# Patient Record
Sex: Female | Born: 1985 | Race: White | Hispanic: No | Marital: Married | Smoking: Never smoker
Health system: Southern US, Community
[De-identification: ages and names within clinical notes are randomized; demographics above are authoritative.]

## PROBLEM LIST (undated history)

## (undated) ENCOUNTER — Inpatient Hospital Stay (HOSPITAL_COMMUNITY): Payer: Self-pay

## (undated) DIAGNOSIS — O039 Complete or unspecified spontaneous abortion without complication: Secondary | ICD-10-CM

## (undated) DIAGNOSIS — Z789 Other specified health status: Secondary | ICD-10-CM

## (undated) HISTORY — DX: Complete or unspecified spontaneous abortion without complication: O03.9

## (undated) HISTORY — PX: DILATION AND CURETTAGE OF UTERUS: SHX78

## (undated) HISTORY — DX: Other specified health status: Z78.9

## (undated) HISTORY — PX: FRACTURE SURGERY: SHX138

---
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
## 2009-05-10 ENCOUNTER — Other Ambulatory Visit: Admission: RE | Admit: 2009-05-10 | Discharge: 2009-05-10 | Payer: Self-pay

## 2009-05-10 ENCOUNTER — Other Ambulatory Visit: Admission: RE | Admit: 2009-05-10 | Discharge: 2009-05-10 | Payer: Self-pay | Admitting: Unknown Physician Specialty

## 2011-07-16 ENCOUNTER — Emergency Department: Payer: Self-pay | Admitting: Emergency Medicine

## 2014-12-20 ENCOUNTER — Other Ambulatory Visit: Payer: Self-pay | Admitting: Obstetrics & Gynecology

## 2014-12-20 DIAGNOSIS — O3680X0 Pregnancy with inconclusive fetal viability, not applicable or unspecified: Secondary | ICD-10-CM

## 2014-12-21 ENCOUNTER — Ambulatory Visit (INDEPENDENT_AMBULATORY_CARE_PROVIDER_SITE_OTHER): Payer: Medicaid Other

## 2014-12-21 DIAGNOSIS — O3680X0 Pregnancy with inconclusive fetal viability, not applicable or unspecified: Secondary | ICD-10-CM | POA: Diagnosis not present

## 2014-12-21 NOTE — Progress Notes (Signed)
US 7+5wks,single IUP w/ys pos fht 163bpm,normal ov's bilat,crl 18.695mm

## 2014-12-29 ENCOUNTER — Ambulatory Visit (INDEPENDENT_AMBULATORY_CARE_PROVIDER_SITE_OTHER): Payer: Medicaid Other | Admitting: Women's Health

## 2014-12-29 ENCOUNTER — Encounter: Payer: Self-pay | Admitting: Women's Health

## 2014-12-29 VITALS — BP 134/82 | HR 92 | Ht 65.5 in | Wt 188.0 lb

## 2014-12-29 DIAGNOSIS — Z369 Encounter for antenatal screening, unspecified: Secondary | ICD-10-CM

## 2014-12-29 DIAGNOSIS — Z1389 Encounter for screening for other disorder: Secondary | ICD-10-CM

## 2014-12-29 DIAGNOSIS — Z3682 Encounter for antenatal screening for nuchal translucency: Secondary | ICD-10-CM

## 2014-12-29 DIAGNOSIS — Z0283 Encounter for blood-alcohol and blood-drug test: Secondary | ICD-10-CM

## 2014-12-29 DIAGNOSIS — Z3491 Encounter for supervision of normal pregnancy, unspecified, first trimester: Secondary | ICD-10-CM

## 2014-12-29 DIAGNOSIS — Z3481 Encounter for supervision of other normal pregnancy, first trimester: Secondary | ICD-10-CM

## 2014-12-29 DIAGNOSIS — Z3A09 9 weeks gestation of pregnancy: Secondary | ICD-10-CM | POA: Diagnosis not present

## 2014-12-29 DIAGNOSIS — Z3490 Encounter for supervision of normal pregnancy, unspecified, unspecified trimester: Secondary | ICD-10-CM | POA: Insufficient documentation

## 2014-12-29 DIAGNOSIS — R110 Nausea: Secondary | ICD-10-CM

## 2014-12-29 DIAGNOSIS — Z331 Pregnant state, incidental: Secondary | ICD-10-CM

## 2014-12-29 LAB — POCT URINALYSIS DIPSTICK
Glucose, UA: NEGATIVE
KETONES UA: NEGATIVE
NITRITE UA: NEGATIVE
Protein, UA: NEGATIVE
RBC UA: NEGATIVE

## 2014-12-29 NOTE — Progress Notes (Signed)
  Subjective:  Megan Hart is a 29 y.o. 692P1001 Caucasian female at 76713w6d by LMP c/w 8wk u/s, being seen today for her first obstetrical visit.  Her obstetrical history is significant for term uncomplicated svb x 1- baby had MAS and had to stay in hospital briefly.  Reports h/o borderline bp's- never been dx w/ HTN or been on meds. Pregnancy history fully reviewed.  Patient reports some nausea, no vomiting- declines meds. Denies vb, cramping, uti s/s, abnormal/malodorous vag d/c, or vulvovaginal itching/irritation.  BP 134/82 mmHg  Pulse 92  Wt 188 lb (85.276 kg)  LMP 10/28/2014 (Exact Date)  HISTORY: OB History  Gravida Para Term Preterm AB SAB TAB Ectopic Multiple Living  2 1 1       1     # Outcome Date GA Lbr Len/2nd Weight Sex Delivery Anes PTL Lv  2 Current           1 Term 09/27/05 5842w0d  6 lb 9 oz (2.977 kg) M Vag-Spont None N Y     Past Medical History  Diagnosis Date  . Medical history non-contributory    Past Surgical History  Procedure Laterality Date  . Fracture surgery     Family History  Problem Relation Age of Onset  . Hypertension Mother   . Hypertension Father   . Heart disease Father   . Aneurysm Father     Exam   System:     General: Well developed & nourished, no acute distress   Skin: Warm & dry, normal coloration and turgor, no rashes   Neurologic: Alert & oriented, normal mood   Cardiovascular: Regular rate & rhythm   Respiratory: Effort & rate normal, LCTAB, acyanotic   Abdomen: Soft, non tender   Extremities: normal strength, tone  Thin prep pap smear neg 2015 @ [facility]   Assessment:   Pregnancy: G2P1001 Patient Active Problem List   Diagnosis Date Noted  . Supervision of normal pregnancy 12/29/2014    Priority: High    29713w6d G2P1001 New OB visit H/O borderline bp's Nausea  Plan:  Initial labs drawn Continue prenatal vitamins Problem list reviewed and updated Reviewed n/v relief measures and warning s/s to report Reviewed  recommended weight gain based on pre-gravid BMI Encouraged well-balanced diet Genetic Screening discussed Integrated Screen: requested Cystic fibrosis screening discussed declined Ultrasound discussed; fetal survey: requested Follow up in 4 weeks for 1st it/nt and visit [facility] completed Declines flu shot  Marge DuncansBooker, [place] Cordts Randall CNM, [facility]NP-BC 12/29/2014 2:46 PM

## 2014-12-29 NOTE — Patient Instructions (Signed)

## 2014-12-30 ENCOUNTER — Telehealth: Payer: Self-pay | Admitting: Adult Health

## 2014-12-30 LAB — GC/CHLAMYDIA PROBE AMP
Chlamydia trachomatis, NAA: NEGATIVE
NEISSERIA GONORRHOEAE BY PCR: NEGATIVE

## 2014-12-30 NOTE — Telephone Encounter (Signed)
Spoke with pt. Pt noticed spotting last night after a BM. No spotting this am. I advised it's not uncommon to have spotting after a BM or after sex. Advised to increase fluids today and don't have sex for 7 days from the last time she wipes any color. Pt voiced understanding. JSY

## 2014-12-31 LAB — PMP SCREEN PROFILE (10S), URINE
Amphetamine Screen, Ur: NEGATIVE ng/mL
BARBITURATE SCRN UR: NEGATIVE ng/mL
BENZODIAZEPINE SCREEN, URINE: NEGATIVE ng/mL
CREATININE(CRT), U: 77.3 mg/dL (ref 20.0–300.0)
Cannabinoids Ur Ql Scn: NEGATIVE ng/mL
Cocaine(Metab.)Screen, Urine: NEGATIVE ng/mL
Methadone Scn, Ur: NEGATIVE ng/mL
Opiate Scrn, Ur: NEGATIVE ng/mL
Oxycodone+Oxymorphone Ur Ql Scn: NEGATIVE ng/mL
PCP Scrn, Ur: NEGATIVE ng/mL
PH UR, DRUG SCRN: 6.2 (ref 4.5–8.9)
PROPOXYPHENE SCREEN: NEGATIVE ng/mL

## 2014-12-31 LAB — RPR: RPR Ser Ql: NONREACTIVE

## 2014-12-31 LAB — CBC
HEMATOCRIT: 38.5 % (ref 34.0–46.6)
HEMOGLOBIN: 12.6 g/dL (ref 11.1–15.9)
MCH: 27.2 pg (ref 26.6–33.0)
MCHC: 32.7 g/dL (ref 31.5–35.7)
MCV: 83 fL (ref 79–97)
Platelets: 415 10*3/uL — ABNORMAL HIGH (ref 150–379)
RBC: 4.64 x10E6/uL (ref 3.77–5.28)
RDW: 15.3 % (ref 12.3–15.4)
WBC: 12.1 10*3/uL — ABNORMAL HIGH (ref 3.4–10.8)

## 2014-12-31 LAB — URINALYSIS, ROUTINE W REFLEX MICROSCOPIC
BILIRUBIN UA: NEGATIVE
GLUCOSE, UA: NEGATIVE
KETONES UA: NEGATIVE
NITRITE UA: NEGATIVE
Protein, UA: NEGATIVE
RBC UA: NEGATIVE
SPEC GRAV UA: 1.013 (ref 1.005–1.030)
UUROB: 0.2 mg/dL (ref 0.2–1.0)
pH, UA: 6.5 (ref 5.0–7.5)

## 2014-12-31 LAB — HIV ANTIBODY (ROUTINE TESTING W REFLEX): HIV SCREEN 4TH GENERATION: NONREACTIVE

## 2014-12-31 LAB — MICROSCOPIC EXAMINATION
Casts: NONE SEEN /lpf
Epithelial Cells (non renal): >10 /hpf — AB (ref 0–10)

## 2014-12-31 LAB — ABO/RH: RH TYPE: POSITIVE

## 2014-12-31 LAB — RUBELLA SCREEN: Rubella Antibodies, IGG: <0.9 index — ABNORMAL LOW (ref >0.99)

## 2014-12-31 LAB — URINE CULTURE

## 2014-12-31 LAB — VARICELLA ZOSTER ANTIBODY, IGG: VARICELLA: 368 {index} (ref >165)

## 2014-12-31 LAB — ANTIBODY SCREEN: ANTIBODY SCREEN: NEGATIVE

## 2014-12-31 LAB — HEPATITIS B SURFACE ANTIGEN: HEP B S AG: NEGATIVE

## 2015-01-03 ENCOUNTER — Telehealth: Payer: Self-pay | Admitting: Women's Health

## 2015-01-03 ENCOUNTER — Encounter: Payer: Self-pay | Admitting: Women's Health

## 2015-01-03 DIAGNOSIS — O9989 Other specified diseases and conditions complicating pregnancy, childbirth and the puerperium: Secondary | ICD-10-CM

## 2015-01-03 DIAGNOSIS — O09899 Supervision of other high risk pregnancies, unspecified trimester: Secondary | ICD-10-CM | POA: Insufficient documentation

## 2015-01-03 DIAGNOSIS — Z2839 Other underimmunization status: Secondary | ICD-10-CM | POA: Insufficient documentation

## 2015-01-03 DIAGNOSIS — Z283 Underimmunization status: Secondary | ICD-10-CM | POA: Insufficient documentation

## 2015-01-03 NOTE — Telephone Encounter (Signed)
Pt c/o spotting during early pregnancy x 6 days, only noted when she wipes after voiding, light cramping. Pt informed to continue to monitor spotting if increases call our office back. Pt verbalized understanding.  Pt ultrasound on 12/21/2014 WNL. Pt is scheduled for an ultrasound appt on 01/26/2015 and also to see a provider.

## 2015-01-18 ENCOUNTER — Encounter: Payer: Self-pay | Admitting: Obstetrics and Gynecology

## 2015-01-18 ENCOUNTER — Telehealth: Payer: Self-pay | Admitting: Women's Health

## 2015-01-18 ENCOUNTER — Ambulatory Visit (INDEPENDENT_AMBULATORY_CARE_PROVIDER_SITE_OTHER): Payer: Medicaid Other | Admitting: Obstetrics and Gynecology

## 2015-01-18 VITALS — BP 146/100 | HR 90 | Wt 188.0 lb

## 2015-01-18 DIAGNOSIS — O209 Hemorrhage in early pregnancy, unspecified: Secondary | ICD-10-CM | POA: Diagnosis not present

## 2015-01-18 DIAGNOSIS — O021 Missed abortion: Secondary | ICD-10-CM | POA: Diagnosis not present

## 2015-01-18 DIAGNOSIS — Z3A11 11 weeks gestation of pregnancy: Secondary | ICD-10-CM | POA: Diagnosis not present

## 2015-01-18 DIAGNOSIS — Z1389 Encounter for screening for other disorder: Secondary | ICD-10-CM | POA: Diagnosis not present

## 2015-01-18 DIAGNOSIS — Z331 Pregnant state, incidental: Secondary | ICD-10-CM | POA: Diagnosis not present

## 2015-01-18 LAB — POCT URINALYSIS DIPSTICK
GLUCOSE UA: NEGATIVE
KETONES UA: NEGATIVE
Leukocytes, UA: NEGATIVE
Nitrite, UA: NEGATIVE
PROTEIN UA: NEGATIVE
RBC UA: 1

## 2015-01-18 NOTE — Telephone Encounter (Signed)
Pt states having vaginal spotting today, noticed in her under garments and when she wiped, no straining with BM, no sex, no pain, no cramping. Pt states she has been having spotting for several weeks and is concerned. Pt given an appt for this afternoon for evaluation.

## 2015-01-18 NOTE — Progress Notes (Signed)
Pt states that she started having some pinkish discharge around 12 today that has now turned to a brownish color. Pt denies any pain.

## 2015-01-18 NOTE — Progress Notes (Signed)
Preoperative History and Physical  Megan Hart is a 29 y.o. G2P1001 here for first trimester bleeding. She is evaluated with u/s by me which shows a nonviable 2.1 cm fetal pole WITHOUT  FCA and with a thickened nuchal translucency that encircles the fetal head. She is counselled for  surgical management of missed AB by suction Dilation and curettage later this week. Pt is to let us know preferred date of procedure..   No significant preoperative concerns.  Proposed surgery: Suction Dilation and Curettage.  Past Medical History  Diagnosis Date  . Medical history non-contributory    Past Surgical History  Procedure Laterality Date  . Fracture surgery     OB History  Gravida Para Term Preterm AB SAB TAB Ectopic Multiple Living  2 1 1       1     # Outcome Date GA Lbr Len/2nd Weight Sex Delivery Anes PTL Lv  2 Current           1 Term 09/27/05 4861w0d  2.977 kg (6 lb 9 oz) M Vag-Spont None N Y    Patient denies any other pertinent gynecologic issues.   Current Outpatient Prescriptions on File Prior to Visit  Medication Sig Dispense Refill  . Multiple Vitamins-Calcium (ONE-A-DAY WOMENS PO) Take by mouth.     No current facility-administered medications on file prior to visit.   No Known Allergies  Social History:   reports that she has never smoked. She does not have any smokeless tobacco history on file. She reports that she does not drink alcohol or use illicit drugs.  Family History  Problem Relation Age of Onset  . Hypertension Mother   . Hypertension Father   . Heart disease Father   . Aneurysm Father     Review of Systems: Noncontributory  PHYSICAL EXAM: Blood pressure 146/100, pulse 90, weight 85.276 kg (188 lb), last menstrual period 10/28/2014. General appearance - alert, well appearing, and in no distress Chest - clear to auscultation, no wheezes, rales or rhonchi, symmetric air entry Heart - normal rate and regular rhythm Abdomen - soft, nontender, nondistended,  no masses or organomegaly Pelvic - ext genitatila normal           Vagina normal            Cervix closed, lite bleeding             U/s By me  Thickened posteior placenta, 2.1 cm fetal pole with absent FCA, and thickened NT grossly abnormal                                 Normal af volume           Adnexa normal Extremities - peripheral pulses normal, no pedal edema, no clubbing or cyanosis  Labs: Results for orders placed or performed in visit on 01/18/15 (from the past 336 hour(s))  POCT urinalysis dipstick   Collection Time: 01/18/15  3:54 PM  Result Value Ref Range   Color, UA     Clarity, UA     Glucose, UA neg    Bilirubin, UA     Ketones, UA neg    Spec Grav, UA     Blood, UA 1    pH, UA     Protein, UA neg    Urobilinogen, UA     Nitrite, UA neg    Leukocytes, UA Negative Negative    Imaging Studies:  US Ob Comp Less 14 Wks  12/27/2014  DATING AND VIABILITY SONOGRAM Megan Hart is a 30 y.o. year old G1P0 with LMP 10/28/2014 which would correlate to  [redacted]w[redacted]d weeks gestation.  She has regular menstrual cycles.   She is here today for a confirmatory initial sonogram. GESTATION: SINGLETON   FETAL ACTIVITY:          Heart rate         163 CERVIX: Appears closed ADNEXA: The ovaries are normal. GESTATIONAL AGE AND  BIOMETRICS: Gestational criteria: Estimated Date of Delivery: 08/04/15 by LMP now at [redacted]w[redacted]d Previous Scans:0    CROWN RUMP LENGTH           18.5 mm         8+2 weeks                                                                      AVERAGE EGA(BY THIS SCAN):  8+2 weeks WORKING EDD( LMP ):  08/04/2015  TECHNICIAN COMMENTS: [place] 7+5wks,single IUP w/ys pos fht 163bpm,normal ov's bilat,crl 18.57mm A copy of this report including all images has been saved and backed up to a second source for retrieval if needed. All measures and details of the anatomical scan, placentation, fluid volume and pelvic anatomy are contained in that report. Megan Hart 12/21/2014 4:20 PM Clinical  Impression and recommendations: I have reviewed the sonogram results above. Combined with the patient's current clinical course, below are my impressions and any appropriate recommendations for management based on the sonographic findings: Singleton IUP with EDD confirmend. Estimated Date of Delivery: 08/04/15 genterally normal maternal anatoomy Megan Hart  Repeat u/s 12/6  : nonviable fetus, absent FCA, posterior placenta, normal adnexa. jvf  Assessment: Patient Active Problem List   Diagnosis Date Noted  . Rubella non-immune status, antepartum 01/03/2015  . Supervision of normal pregnancy 12/29/2014    Plan: Patient will undergo surgical management with Suction Dilation and curettage Pt will let us know in a.m. Which day works for her. Will perform this week [name], MD .    .mec 01/18/2015 5:36 PM

## 2015-01-19 ENCOUNTER — Other Ambulatory Visit: Payer: Self-pay | Admitting: Obstetrics and Gynecology

## 2015-01-19 ENCOUNTER — Telehealth: Payer: Self-pay | Admitting: Obstetrics and Gynecology

## 2015-01-19 DIAGNOSIS — O021 Missed abortion: Secondary | ICD-10-CM

## 2015-01-19 MED ORDER — MISOPROSTOL 200 MCG PO TABS: ORAL_TABLET | ORAL | Status: DC

## 2015-01-26 ENCOUNTER — Encounter: Payer: Medicaid Other | Admitting: Obstetrics & Gynecology

## 2015-01-26 ENCOUNTER — Other Ambulatory Visit: Payer: Medicaid Other

## 2015-02-02 ENCOUNTER — Encounter: Payer: Self-pay | Admitting: Obstetrics and Gynecology

## 2015-02-02 ENCOUNTER — Ambulatory Visit (INDEPENDENT_AMBULATORY_CARE_PROVIDER_SITE_OTHER): Payer: Medicaid Other | Admitting: Obstetrics and Gynecology

## 2015-02-02 VITALS — BP 120/80 | Ht 65.0 in | Wt 188.0 lb

## 2015-02-02 DIAGNOSIS — O034 Incomplete spontaneous abortion without complication: Secondary | ICD-10-CM

## 2015-02-02 DIAGNOSIS — N939 Abnormal uterine and vaginal bleeding, unspecified: Secondary | ICD-10-CM

## 2015-02-02 NOTE — Progress Notes (Signed)
Patient ID: Megan Lesia HausenM Hart, female   DOB: 05/13/1985, 29 y.o.   MRN: [id number]   [facility]  Patient name: Megan Hart MRN [id number]  Date of birth: 12/21/1985  CC & HPI:  Megan Hart is a 29 y.o. female presenting today for follow up [facility]. Pt was seen in the office on 01/18/15 and had fetal u/s showing nonviable fetus at at 10224w5d. Pt was prescribed cytotec by this office, but did not have it filled. She was counselled forsurgical management of missed AB by suction Dilation and curettage later that week if cytotec was ineffective, but [facility] and vaginal bleeding 2 days later. Pt reports that she has stopped bleeding since D&C. She notes that she is looking to get pregnant in the next few months.   ROS:  A complete 10 system review of systems was obtained and all systems are negative except as noted in the HPI and PMH.    Pertinent History Reviewed:   Reviewed: Significant for D&C of uterus; spontaneous abortion  Medical         Past Medical History  Diagnosis Date   Medical history non-contributory                               Surgical Hx:    Past Surgical History  Procedure Laterality Date   Fracture surgery     Dilation and curettage of uterus     Medications: Reviewed & Updated - see associated section                       Current outpatient prescriptions:    Multiple Vitamins-Calcium (ONE-A-DAY WOMENS PO), Take by mouth., Disp: , Rfl:    Social History: Reviewed -  reports that she has never smoked. She has never used smokeless tobacco.  Objective Findings:  Vitals: Blood pressure 120/80, height 5\' 5"  (1.651 m), weight 188 lb (85.276 kg), last menstrual period 10/28/2014, not currently breastfeeding.  Physical Examination: General appearance - alert, well appearing, and in no distress Mental status - alert, oriented to person, place, and time  Discussed with pt well-being s/p spontaneous  abortion and D&C. At end of discussion, pt had opportunity to ask questions and has no further questions at this time. Pt does not wish to start oral contraceptives and desires to conceive. Greater than 50% was spent in counseling and coordination of care with the patient. Total time greater than: 25 minutes   Assessment & Plan:   A:  1. S/p D&C on 01/20/15 2. Vaginal bleeding stopped 3. Pt expresses desire to conceive soon  P:  1. Advised pt to follow ovulation patterns using [email or web address] 2. F/u prn      By signing my name below, I, Doreatha MartinEva Mathews, attest that this documentation has been prepared under the direction and in the presence of Tilda BurrowJohn Ferguson V, MD. Electronically Signed: Doreatha MartinEva Mathews, ED Scribe. 02/02/2015. 9:49 AM.  I personally performed the services described in this documentation, which was SCRIBED in my presence. The recorded information has been reviewed and considered accurate. It has been edited as necessary during review. [name],JOHN V, MD

## 2015-02-02 NOTE — Progress Notes (Signed)
Patient ID: Megan Hart, female   DOB: 10/24/1985, 29 y.o.   MRN: [id number] Pt here today for follow up visit after a D&C. Pt states that she has stopped bleeding and that she has a back cramp every now and then but nothing major.

## 2015-04-07 ENCOUNTER — Other Ambulatory Visit: Payer: Self-pay | Admitting: Obstetrics & Gynecology

## 2015-04-07 DIAGNOSIS — O3680X0 Pregnancy with inconclusive fetal viability, not applicable or unspecified: Secondary | ICD-10-CM

## 2015-04-14 ENCOUNTER — Ambulatory Visit (INDEPENDENT_AMBULATORY_CARE_PROVIDER_SITE_OTHER): Payer: Medicaid Other

## 2015-04-14 DIAGNOSIS — O3680X0 Pregnancy with inconclusive fetal viability, not applicable or unspecified: Secondary | ICD-10-CM | POA: Diagnosis not present

## 2015-04-14 NOTE — Progress Notes (Signed)
[place] 6+5wks,single IUP w/ a enlarged ys 6 mm,fhr 108 bpm,normal ov's bilat,results were discussed with Dr. Despina Hidden

## 2015-04-21 ENCOUNTER — Other Ambulatory Visit (HOSPITAL_COMMUNITY)
Admission: RE | Admit: 2015-04-21 | Discharge: 2015-04-21 | Disposition: A | Discharge status: Home / Self Care | Payer: Medicaid Other | Source: Ambulatory Visit | Attending: Adult Health | Admitting: Adult Health

## 2015-04-21 ENCOUNTER — Other Ambulatory Visit (INDEPENDENT_AMBULATORY_CARE_PROVIDER_SITE_OTHER): Payer: Medicaid Other

## 2015-04-21 ENCOUNTER — Ambulatory Visit (INDEPENDENT_AMBULATORY_CARE_PROVIDER_SITE_OTHER): Payer: Medicaid Other | Admitting: Adult Health

## 2015-04-21 ENCOUNTER — Encounter: Payer: Self-pay | Admitting: Adult Health

## 2015-04-21 ENCOUNTER — Other Ambulatory Visit: Payer: Self-pay | Admitting: Adult Health

## 2015-04-21 VITALS — BP 140/80 | HR 94 | Wt 191.5 lb

## 2015-04-21 DIAGNOSIS — Z113 Encounter for screening for infections with a predominantly sexual mode of transmission: Secondary | ICD-10-CM | POA: Insufficient documentation

## 2015-04-21 DIAGNOSIS — Z1151 Encounter for screening for human papillomavirus (HPV): Secondary | ICD-10-CM | POA: Diagnosis not present

## 2015-04-21 DIAGNOSIS — Z0283 Encounter for blood-alcohol and blood-drug test: Secondary | ICD-10-CM

## 2015-04-21 DIAGNOSIS — Z3A08 8 weeks gestation of pregnancy: Secondary | ICD-10-CM

## 2015-04-21 DIAGNOSIS — Z1389 Encounter for screening for other disorder: Secondary | ICD-10-CM | POA: Diagnosis not present

## 2015-04-21 DIAGNOSIS — Z331 Pregnant state, incidental: Secondary | ICD-10-CM | POA: Diagnosis not present

## 2015-04-21 DIAGNOSIS — O3680X0 Pregnancy with inconclusive fetal viability, not applicable or unspecified: Secondary | ICD-10-CM | POA: Diagnosis not present

## 2015-04-21 DIAGNOSIS — Z01411 Encounter for gynecological examination (general) (routine) with abnormal findings: Secondary | ICD-10-CM | POA: Diagnosis present

## 2015-04-21 DIAGNOSIS — Z369 Encounter for antenatal screening, unspecified: Secondary | ICD-10-CM

## 2015-04-21 DIAGNOSIS — O039 Complete or unspecified spontaneous abortion without complication: Secondary | ICD-10-CM | POA: Insufficient documentation

## 2015-04-21 HISTORY — DX: Complete or unspecified spontaneous abortion without complication: O03.9

## 2015-04-21 LAB — POCT URINALYSIS DIPSTICK
GLUCOSE UA: NEGATIVE
Ketones, UA: NEGATIVE
Leukocytes, UA: NEGATIVE
Nitrite, UA: NEGATIVE
Protein, UA: NEGATIVE
RBC UA: NEGATIVE

## 2015-04-21 NOTE — Progress Notes (Signed)
Tannie was here for new OB visit, has no bleeding,exam performed: Skin warm and dry. Neck: mid line trachea, normal thyroid, good ROM, no lymphadenopathy noted. Lungs: clear to ausculation bilaterally. Cardiovascular: regular rate and rhythm.Abdomen soft and non tender. Pelvic: external genitalia is normal in appearance no lesions, vagina: scant discharge without odor,urethra has no lesions or masses noted, cervix is  Bulbous, pap with GC/CHL and HPV performed, uterus: anterior, about 8 week size, shape and contour, non tender, no masses felt, adnexa: no masses or tenderness noted. Bladder is non tender and no masses felt, US performed by me and no FHM seen and then Amber did US and no FHM seen, fetal pole was about 7+5 weeks. Her Blood type is O+.She [facility] Dr Emelda FearFerguson. Discussed options of waiting to see what her body does, or cytotec or even D&C, and told her could even check another US next week, if she so desired.She is going to discuss with partner and let me know on next day or 2 her plans, but will schedule follow up appt. In 1 week, can cancel if not needed.  Face time 25 minutes, with 50 % counseling as above.

## 2015-04-21 NOTE — Progress Notes (Signed)
US  7+5wks,sigle IUP w/no fht,crl 13.959mm, normal ov's bilat,pt was seen by Victorino DikeJennifer after ultrasound

## 2015-04-21 NOTE — Patient Instructions (Signed)
Miscarriage A miscarriage is the sudden loss of an unborn baby (fetus) before the 20th week of pregnancy. Most miscarriages happen in the first 3 months of pregnancy. Sometimes, it happens before a woman even knows she is pregnant. A miscarriage is also called a "spontaneous miscarriage" or "early pregnancy loss." Having a miscarriage can be an emotional experience. Talk with your caregiver about any questions you may have about miscarrying, the grieving process, and your future pregnancy plans. CAUSES   Problems with the fetal chromosomes that make it impossible for the baby to develop normally. Problems with the baby's genes or chromosomes are most often the result of errors that occur, by chance, as the embryo divides and grows. The problems are not inherited from the parents.  Infection of the cervix or uterus.   Hormone problems.   Problems with the cervix, such as having an incompetent cervix. This is when the tissue in the cervix is not strong enough to hold the pregnancy.   Problems with the uterus, such as an abnormally shaped uterus, uterine fibroids, or congenital abnormalities.   Certain medical conditions.   Smoking, drinking alcohol, or taking illegal drugs.   Trauma.  Often, the cause of a miscarriage is unknown.  SYMPTOMS   Vaginal bleeding or spotting, with or without cramps or pain.  Pain or cramping in the abdomen or lower back.  Passing fluid, tissue, or blood clots from the vagina. DIAGNOSIS  Your caregiver will perform a physical exam. You may also have an ultrasound to confirm the miscarriage. Blood or urine tests may also be ordered. TREATMENT   Sometimes, treatment is not necessary if you naturally pass all the fetal tissue that was in the uterus. If some of the fetus or placenta remains in the body (incomplete miscarriage), tissue left behind may become infected and must be removed. Usually, a dilation and curettage (D and C) procedure is performed.  During a D and C procedure, the cervix is widened (dilated) and any remaining fetal or placental tissue is gently removed from the uterus.  Antibiotic medicines are prescribed if there is an infection. Other medicines may be given to reduce the size of the uterus (contract) if there is a lot of bleeding.  If you have Rh negative blood and your baby was Rh positive, you will need a Rh immunoglobulin shot. This shot will protect any future baby from having Rh blood problems in future pregnancies. HOME CARE INSTRUCTIONS   Your caregiver may order bed rest or may allow you to continue light activity. Resume activity as directed by your caregiver.  Have someone help with home and family responsibilities during this time.   Keep track of the number of sanitary pads you use each day and how soaked (saturated) they are. Write down this information.   Do not use tampons. Do not douche or have sexual intercourse until approved by your caregiver.   Only take over-the-counter or prescription medicines for pain or discomfort as directed by your caregiver.   Do not take aspirin. Aspirin can cause bleeding.   Keep all follow-up appointments with your caregiver.   If you or your partner have problems with grieving, talk to your caregiver or seek counseling to help cope with the pregnancy loss. Allow enough time to grieve before trying to get pregnant again.  SEEK IMMEDIATE MEDICAL CARE IF:   You have severe cramps or pain in your back or abdomen.  You have a fever.  You pass large blood clots (walnut-sized   or larger) ortissue from your vagina. Save any tissue for your caregiver to inspect.   Your bleeding increases.   You have a thick, bad-smelling vaginal discharge.  You become lightheaded, weak, or you faint.   You have chills.  MAKE SURE YOU:  Understand these instructions.  Will watch your condition.  Will get help right away if you are not doing well or get worse.   This  information is not intended to replace advice given to you by your health care provider. Make sure you discuss any questions you have with your health care provider.   Document Released: 07/25/2000 Document Revised: 05/26/2012 Document Reviewed: 03/20/2011 Elsevier Interactive Patient Education [employer]. Call me with decision

## 2015-04-21 NOTE — Addendum Note (Signed)
Addended by: Colen DarlingYOUNG, Sian Joles S on: 04/21/2015 03:06 PM   Modules accepted: Orders

## 2015-04-22 LAB — PMP SCREEN PROFILE (10S), URINE
AMPHETAMINE SCRN UR: NEGATIVE ng/mL
Barbiturate Screen, Ur: NEGATIVE ng/mL
Benzodiazepine Screen, Urine: NEGATIVE ng/mL
COCAINE(METAB.) SCREEN, URINE: NEGATIVE ng/mL
Cannabinoids Ur Ql Scn: NEGATIVE ng/mL
Creatinine(Crt), U: 175.6 mg/dL (ref 20.0–300.0)
METHADONE SCREEN, URINE: NEGATIVE ng/mL
OPIATE SCRN UR: NEGATIVE ng/mL
OXYCODONE+OXYMORPHONE UR QL SCN: NEGATIVE ng/mL
PCP SCRN UR: NEGATIVE ng/mL
PROPOXYPHENE SCREEN: NEGATIVE ng/mL
Ph of Urine: 5.8 (ref 4.5–8.9)

## 2015-04-22 NOTE — Progress Notes (Signed)
Addendum: ROS: Patient denies any headaches, hearing loss, fatigue, blurred vision, shortness of breath, chest pain, abdominal pain, problems with bowel movements, urination, or intercourse. No joint pain or mood swings.No bleeding Reviewed past medical,surgical, social and family history. Reviewed medications and allergies. VS: BP 140/80, wt: 191.5 lbs

## 2015-04-25 ENCOUNTER — Telehealth: Payer: Self-pay | Admitting: Adult Health

## 2015-04-25 ENCOUNTER — Encounter: Payer: Self-pay | Admitting: Obstetrics and Gynecology

## 2015-04-25 ENCOUNTER — Ambulatory Visit (INDEPENDENT_AMBULATORY_CARE_PROVIDER_SITE_OTHER): Payer: Medicaid Other | Admitting: Obstetrics and Gynecology

## 2015-04-25 ENCOUNTER — Other Ambulatory Visit: Payer: Self-pay | Admitting: Obstetrics and Gynecology

## 2015-04-25 VITALS — BP 142/90 | Ht 66.0 in | Wt 192.0 lb

## 2015-04-25 DIAGNOSIS — O039 Complete or unspecified spontaneous abortion without complication: Secondary | ICD-10-CM

## 2015-04-25 LAB — CYTOLOGY - PAP

## 2015-04-25 NOTE — Progress Notes (Signed)
   [facility]  Patient name: Megan Hart MRN [id number]  Date of birth: 12/23/1985  CC & HPI:  Megan Hart is a 30 y.o. female presenting today for follow-up of a mriscariage. Patient [facility] due to [facility]. Patient desires a D&C presently. Patient has known allergies to morphine, which cause nausea and vomiting. She states she does not have any dentures, partial plates, or caps.   ROS:  A complete review of systems was obtained and all systems are negative except as noted in the HPI and PMH.    Pertinent History Reviewed:   Reviewed: Significant for D&C of uterus Medical         Past Medical History  Diagnosis Date  . Medical history non-contributory   . Miscarriage 04/21/2015                              Surgical Hx:    Past Surgical History  Procedure Laterality Date  . Fracture surgery    . Dilation and curettage of uterus     Medications: Reviewed & Updated - see associated section                      No current outpatient prescriptions on file.   Social History: Reviewed -  reports that she has never smoked. She has never used smokeless tobacco.  Objective Findings:  Vitals: Blood pressure 142/90, height 5\' 6"  (1.676 m), weight 192 lb (87.091 kg), last menstrual period 02/21/2015, not currently breastfeeding.  Physical Examination: General appearance - alert, well appearing, and in no distress, oriented to person, place, and time and overweight Mental status - alert, oriented to person, place, and time, normal mood, behavior, speech, dress, motor activity, and thought processes, affect appropriate to mood Eyes - pupils equal and reactive, extraocular eye movements intact Mouth - mucous membranes moist, pharynx normal without lesions Chest - clear to auscultation, no wheezes, rales or rhonchi, symmetric air entry Heart - normal rate, regular rhythm, normal S1, S2, no murmurs, rubs, clicks or  gallops Abdomen - soft, nontender, nondistended, no masses or organomegaly  Pelvic: done per JAGriffin RN last week. Discussed with pt risks and benefits of D&C. At end of discussion, pt had opportunity to ask questions and has no further questions at this time.   Greater than 50% was spent in counseling and coordination of care with the patient. Total time greater than: 15 minutes    Assessment & Plan:   A:  1. Follow-up visit of miscarriage.   P:  1. Schedule Suction D&C for Friday 04/29/15 at 7:30 am..    By signing my name below, I, [name], attest that this documentation has been prepared under the direction and in the presence of Tilda BurrowJohn Kyara Boxer V, MD. Electronically Signed: [name], ED Scribe. 04/25/2015. 4:29 PM.  I personally performed the services described in this documentation, which was SCRIBED in my presence. The recorded information has been reviewed and considered accurate. It has been edited as necessary during review. [name],Adriel Kessen V, MD

## 2015-04-25 NOTE — Telephone Encounter (Signed)
Pt called earlier this am and wants to proceed with D&C to come in at 3:45 pm to see Dr Emelda FearFerguson for pre [facility]

## 2015-04-25 NOTE — Progress Notes (Signed)
Patient ID: Megan Hart, female   DOB: 11/16/1985, 30 y.o.   MRN: [id number] Pt here today for follow up of miscarriage. Pt denies any bleeding.

## 2015-04-26 ENCOUNTER — Other Ambulatory Visit: Payer: Self-pay | Admitting: Obstetrics and Gynecology

## 2015-04-26 NOTE — Patient Instructions (Signed)
Megan Hart  04/26/2015     @   Your procedure is scheduled on   04/29/2015   Report to [facility] at  615  A.M.  Call this number if you have problems the morning of surgery:  [phone]   Remember:  Do not eat food or drink liquids after midnight.  Take these medicines the morning of surgery with A SIP OF WATER  none   Do not wear jewelry, make-up or nail polish.  Do not wear lotions, powders, or perfumes.  You may wear deodorant.  Do not shave 48 hours prior to surgery.  Men may shave face and neck.  Do not bring valuables to the hospital.  [facility] is not responsible for any belongings or valuables.  Contacts, dentures or bridgework may not be worn into surgery.  Leave your suitcase in the car.  After surgery it may be brought to your room.  For patients admitted to the hospital, discharge time will be determined by your treatment team.  Patients discharged the day of surgery will not be allowed to drive home.   Name and phone number of your driver:   family Special instructions:  none  Please read over the following fact sheets that you were given. Coughing and Deep Breathing, Surgical Site Infection Prevention, Anesthesia Post-op Instructions and Care and Recovery After Surgery      Dilation and Curettage or Vacuum Curettage Dilation and curettage (D&C) and vacuum curettage are minor procedures. A D&C involves stretching (dilation) the cervix and scraping (curettage) the inside lining of the womb (uterus). During a D&C, tissue is gently scraped from the inside lining of the uterus. During a vacuum curettage, the lining and tissue in the uterus are removed with the use of gentle suction.  Curettage may be performed to either diagnose or treat a problem. As a diagnostic procedure, curettage is performed to examine tissues from the uterus. A diagnostic curettage may be performed for the following symptoms:   Irregular bleeding in  the uterus.   Bleeding with the development of clots.   Spotting between menstrual periods.   Prolonged menstrual periods.   Bleeding after menopause.   No menstrual period (amenorrhea).   A change in size and shape of the uterus.  As a treatment procedure, curettage may be performed for the following reasons:   Removal of an IUD (intrauterine device).   Removal of retained placenta after giving birth. Retained placenta can cause an infection or bleeding severe enough to require transfusions.   Abortion.   Miscarriage.   Removal of polyps inside the uterus.   Removal of uncommon types of noncancerous lumps (fibroids).  LET [facility] CARE PROVIDER KNOW ABOUT:   Any allergies you have.   All medicines you are taking, including vitamins, herbs, eye drops, creams, and over-the-counter medicines.   Previous problems you or members of your family have had with the use of anesthetics.   Any blood disorders you have.   Previous surgeries you have had.   Medical conditions you have. RISKS AND COMPLICATIONS  Generally, this is a safe procedure. However, as with any procedure, complications can occur. Possible complications include:  Excessive bleeding.   Infection of the uterus.   Damage to the cervix.   Development of scar tissue (adhesions) inside the uterus, later causing abnormal amounts of menstrual bleeding.   Complications from the general anesthetic, if a general anesthetic is used.  Putting a hole (perforation) in the uterus. This is rare.  BEFORE THE PROCEDURE   Eat and drink before the procedure only as directed by your health care provider.   Arrange for someone to take you home.  PROCEDURE  This procedure usually takes about 15-30 minutes.  You will be given one of the following:  A medicine that numbs the area in and around the cervix (local anesthetic).   A medicine to make you sleep through the procedure (general  anesthetic).  You will lie on your back with your legs in stirrups.   A warm metal or plastic instrument (speculum) will be placed in your vagina to keep it open and to allow the health care provider to see the cervix.  There are two ways in which your cervix can be softened and dilated. These include:   Taking a medicine.   Having thin rods (laminaria) inserted into your cervix.   A curved tool (curette) will be used to scrape cells from the inside lining of the uterus. In some cases, gentle suction is applied with the curette. The curette will then be removed.  AFTER THE PROCEDURE   You will rest in the recovery area until you are stable and are ready to go home.   You may feel sick to your stomach (nauseous) or throw up (vomit) if you were given a general anesthetic.   You may have a sore throat if a tube was placed in your throat during general anesthesia.   You may have light cramping and bleeding. This may last for 2 days to 2 weeks after the procedure.   Your uterus needs to make a new lining after the procedure. This may make your next period late.   This information is not intended to replace advice given to you by your health care provider. Make sure you discuss any questions you have with your health care provider.   Document Released: 01/29/2005 Document Revised: 10/01/2012 Document Reviewed: 08/28/2012 Elsevier Interactive Patient Education 2016 [employer] Dilation and Curettage or Vacuum Curettage, Care After These instructions give you information on caring for yourself after your procedure. Your doctor may also give you more specific instructions. Call your doctor if you have any problems or questions after your procedure. HOME CARE  Do not drive for 24 hours.  Wait 1 week before doing any activities that wear you out.  Take your temperature 2 times a day for 4 days. Write it down. Tell your doctor if you have a fever.  Do not stand for a long  time.  Do not lift, push, or pull anything over 10 pounds (4.5 kilograms).  Limit stair climbing to once or twice a day.  Rest often.  Continue with your usual diet.  Drink enough fluids to keep your pee (urine) clear or pale yellow.  If you have a hard time pooping (constipation), you may:  Take a medicine to help you go poop (laxative) as told by your doctor.  Eat more fruit and bran.  Drink more fluids.  Take showers, not baths, for as long as told by your doctor.  Do not swim or use a hot tub until your doctor says it is okay.  Have someone with you for 1-2 days after the procedure.  Do not douche, use tampons, or have sex (intercourse) for 2 weeks.  Only take medicines as told by your doctor. Do not take aspirin. It can cause bleeding.  Keep all doctor visits. GET HELP IF:  You have cramps or pain not helped by medicine.  You have new pain in the belly (abdomen).  You have a bad smelling fluid coming from your vagina.  You have a rash.  You have problems with any medicine. GET HELP RIGHT AWAY IF:   You start to bleed more than a regular period.  You have a fever.  You have chest pain.  You have trouble breathing.  You feel dizzy or feel like passing out (fainting).  You pass out.  You have pain in the tops of your shoulders.  You have vaginal bleeding with or without clumps of blood (blood clots). MAKE SURE YOU:  Understand these instructions.  Will watch your condition.  Will get help right away if you are not doing well or get worse.   This information is not intended to replace advice given to you by your health care provider. Make sure you discuss any questions you have with your health care provider.   Document Released: 11/08/2007 Document Revised: 02/03/2013 Document Reviewed: 08/28/2012 Elsevier Interactive Patient Education 2016 [employer] PATIENT INSTRUCTIONS POST-ANESTHESIA  IMMEDIATELY FOLLOWING SURGERY:  Do not drive or  operate machinery for the first twenty four hours after surgery.  Do not make any important decisions for twenty four hours after surgery or while taking narcotic pain medications or sedatives.  If you develop intractable nausea and vomiting or a severe headache please notify your doctor immediately.  FOLLOW-UP:  Please make an appointment with your surgeon as instructed. You do not need to follow up with anesthesia unless specifically instructed to do so.  WOUND CARE INSTRUCTIONS (if applicable):  Keep a dry clean dressing on the anesthesia/puncture wound site if there is drainage.  Once the wound has quit draining you may leave it open to air.  Generally you should leave the bandage intact for twenty four hours unless there is drainage.  If the epidural site drains for more than 36-48 hours please call the anesthesia department.  QUESTIONS?:  Please feel free to call your physician or the hospital operator if you have any questions, and they will be happy to assist you.

## 2015-04-26 NOTE — H&P (Signed)
  Tilda BurrowJohn Jolana Runkles V, MD at 04/25/2015 4:17 PM     Status: Signed       Expand All Collapse All    [facility] Visit  Patient name: Megan Judie PetitM LesterMRN [id number] Date of birth: 11/16/1985  CC & HPI:  Megan Hart is a 30 y.o. female presenting today for follow-up of a mriscariage. Patient [facility] due to pain. Patient desires a D&C presently. Patient has known allergies to morphine, which cause nausea and vomiting. She states she does not have any dentures, partial plates, or caps.  ROS:  A complete review of systems was obtained and all systems are negative except as noted in the HPI and PMH.   Pertinent History Reviewed:  Reviewed: Significant for D&C of uterus Medical  Past Medical History  Diagnosis Date  . Medical history non-contributory   . Miscarriage 04/21/2015    Surgical Hx:  Past Surgical History  Procedure Laterality Date  . Fracture surgery    . Dilation and curettage of uterus     Medications: Reviewed & Updated - see associated section  No current outpatient prescriptions on file.   Social History: Reviewed -  reports that she has never smoked. She has never used smokeless tobacco.  Objective Findings:  Vitals: Blood pressure 142/90, height 5\' 6"  (1.676 Megan), weight 192 lb (87.091 kg), last menstrual period 02/21/2015, not currently breastfeeding.  Physical Examination: General appearance - alert, well appearing, and in no distress, oriented to person, place, and time and overweight Mental status - alert, oriented to person, place, and time, normal mood, behavior, speech, dress, motor activity, and thought processes, affect appropriate to mood Eyes - pupils equal and reactive, extraocular eye movements intact Mouth - mucous membranes moist, pharynx normal without lesions Chest - clear to  auscultation, no wheezes, rales or rhonchi, symmetric air entry Heart - normal rate, regular rhythm, normal S1, S2, no murmurs, rubs, clicks or gallops Abdomen - soft, nontender, nondistended, no masses or organomegaly Pelvic: done per JAGriffin RN last week. Discussed with pt risks and benefits of D&C. At end of discussion, pt had opportunity to ask questions and has no further questions at this time.  Rh positivei per old records Greater than 50% was spent in counseling and coordination of care with the patient. Total time greater than: 15 minutes    Assessment & Plan:   A:  1. Follow-up visit of miscarriage.   P:  1. Schedule Suction D&C for Friday 04/29/15 at 7:30 am..   By signing my name below, I, [name], attest that this documentation has been prepared under the direction and in the presence of Tilda BurrowJohn Hideo Googe V, MD. Electronically Signed: [name], ED Scribe. 04/25/2015. 4:29 PM.  I personally performed the services described in this documentation, which was SCRIBED in my presence. The recorded information has been reviewed and considered accurate. It has been edited as necessary during review. [name],[name], MD

## 2015-04-27 ENCOUNTER — Encounter (HOSPITAL_COMMUNITY): Payer: Self-pay

## 2015-04-27 ENCOUNTER — Encounter (HOSPITAL_COMMUNITY)
Admission: RE | Admit: 2015-04-27 | Discharge: 2015-04-27 | Disposition: A | Discharge status: Home / Self Care | Payer: Medicaid Other | Source: Ambulatory Visit | Attending: Obstetrics and Gynecology | Admitting: Obstetrics and Gynecology

## 2015-04-27 ENCOUNTER — Other Ambulatory Visit: Payer: Self-pay | Admitting: Obstetrics and Gynecology

## 2015-04-27 DIAGNOSIS — Z01812 Encounter for preprocedural laboratory examination: Secondary | ICD-10-CM | POA: Diagnosis not present

## 2015-04-27 LAB — CBC
HEMATOCRIT: 34 % — AB (ref 36.0–46.0)
Hemoglobin: 10.9 g/dL — ABNORMAL LOW (ref 12.0–15.0)
MCH: 25.7 pg — ABNORMAL LOW (ref 26.0–34.0)
MCHC: 32.1 g/dL (ref 30.0–36.0)
MCV: 80.2 fL (ref 78.0–100.0)
PLATELETS: 416 10*3/uL — AB (ref 150–400)
RBC: 4.24 MIL/uL (ref 3.87–5.11)
RDW: 15.5 % (ref 11.5–15.5)
WBC: 9.7 10*3/uL (ref 4.0–10.5)

## 2015-04-27 NOTE — Pre-Procedure Instructions (Signed)
Patient given information to sign up for my chart at home. 

## 2015-04-27 NOTE — H&P (Signed)
  Progress Notes    Expand All Collapse All    [facility] Visit  Patient name: Megan HartMRN [id number] Date of birth: 03/31/1985  CC & HPI:  Megan Hart is a 30 y.o. female presenting today for follow-up of missed abortion. Pt has not passed tissue. Pt is NOT interested in Cytotec Therapy to provoke spont Ab.. Patient states she had a D&C at [facility] in the past for miscarriage due to pain. Patient desires a D&C presently. Patient has known allergies to morphine, which cause nausea and vomiting. She states she does not have any dentures, partial plates, or caps.  ROS:  A complete review of systems was obtained and all systems are negative except as noted in the HPI and PMH.   Pertinent History Reviewed:  Reviewed: Significant for D&C of uterus Medical  Past Medical History  Diagnosis Date  . Medical history non-contributory   . Miscarriage 04/21/2015    Surgical Hx:  Past Surgical History  Procedure Laterality Date  . Fracture surgery    . Dilation and curettage of uterus     Medications: Reviewed & Updated - see associated section  No current outpatient prescriptions on file.   Social History: Reviewed -  reports that she has never smoked. She has never used smokeless tobacco.  Objective Findings:  Vitals: Blood pressure 142/90, height 5' 6" (1.676 m), weight 192 lb (87.091 kg), last menstrual period 02/21/2015, not currently breastfeeding.  Physical Examination: General appearance - alert, well appearing, and in no distress, oriented to person, place, and time and overweight Mental status - alert, oriented to person, place, and time, normal mood, behavior, speech, dress, motor activity, and thought processes, affect appropriate to mood Eyes - pupils equal and reactive, extraocular eye movements intact Mouth - mucous membranes moist, pharynx normal  without lesions Chest - clear to auscultation, no wheezes, rales or rhonchi, symmetric air entry Heart - normal rate, regular rhythm, normal S1, S2, no murmurs, rubs, clicks or gallops Abdomen - soft, nontender, nondistended, no masses or organomegaly Pelvic: done per JAGriffin RN last week. Discussed with pt risks and benefits of D&C. At end of discussion, pt had opportunity to ask questions and has no further questions at this time.   Greater than 50% was spent in counseling and coordination of care with the patient. Total time greater than: 15 minutes    Assessment & Plan:   A:  1. Follow-up visit of miscarriage.   P:  1. Schedule Suction D&C for Friday 04/29/15 at 7:30 am..   By signing my name below, I, Suzanne Le, attest that this documentation has been prepared under the direction and in the presence of Jamison Yuhasz V, MD. Electronically Signed: Suzanne Le, ED Scribe. 04/25/2015. 4:29 PM.  I personally performed the services described in this documentation, which was SCRIBED in my presence. The recorded information has been reviewed and considered accurate. It has been edited as necessary during review.      

## 2015-04-28 ENCOUNTER — Ambulatory Visit: Payer: Medicaid Other | Admitting: Obstetrics and Gynecology

## 2015-04-29 ENCOUNTER — Encounter (HOSPITAL_COMMUNITY): Payer: Self-pay | Admitting: *Deleted

## 2015-04-29 ENCOUNTER — Ambulatory Visit (HOSPITAL_COMMUNITY): Payer: Medicaid Other | Admitting: Anesthesiology

## 2015-04-29 ENCOUNTER — Ambulatory Visit (HOSPITAL_COMMUNITY)
Admission: RE | Admit: 2015-04-29 | Discharge: 2015-04-29 | Disposition: A | Discharge status: Home / Self Care | Payer: Medicaid Other | Source: Ambulatory Visit | Attending: Obstetrics and Gynecology | Admitting: Obstetrics and Gynecology

## 2015-04-29 ENCOUNTER — Encounter (HOSPITAL_COMMUNITY)
Admission: RE | Disposition: A | Discharge status: Home / Self Care | Payer: Self-pay | Source: Ambulatory Visit | Attending: Obstetrics and Gynecology

## 2015-04-29 DIAGNOSIS — O021 Missed abortion: Secondary | ICD-10-CM | POA: Insufficient documentation

## 2015-04-29 HISTORY — PX: DILATION AND CURETTAGE OF UTERUS: SHX78

## 2015-04-29 SURGERY — DILATION AND CURETTAGE
Anesthesia: General | Site: Uterus

## 2015-04-29 MED ORDER — LIDOCAINE HCL (PF) 1 % IJ SOLN
INTRAMUSCULAR | Status: AC
  Filled 2015-04-29: qty 5

## 2015-04-29 MED ORDER — FENTANYL CITRATE (PF) 100 MCG/2ML IJ SOLN
INTRAMUSCULAR | Status: DC | PRN
  Administered 2015-04-29 (×2): 50 ug via INTRAVENOUS

## 2015-04-29 MED ORDER — PROPOFOL 10 MG/ML IV BOLUS
INTRAVENOUS | Status: AC
  Filled 2015-04-29: qty 20

## 2015-04-29 MED ORDER — HYDROCODONE-ACETAMINOPHEN 5-325 MG PO TABS: 1.0000 | ORAL_TABLET | Freq: Four times a day (QID) | ORAL | Status: DC | PRN

## 2015-04-29 MED ORDER — FENTANYL CITRATE (PF) 100 MCG/2ML IJ SOLN
INTRAMUSCULAR | Status: AC
  Filled 2015-04-29: qty 2

## 2015-04-29 MED ORDER — ONDANSETRON HCL 4 MG/2ML IJ SOLN
INTRAMUSCULAR | Status: AC
  Filled 2015-04-29: qty 2

## 2015-04-29 MED ORDER — MIDAZOLAM HCL 2 MG/2ML IJ SOLN
INTRAMUSCULAR | Status: AC
  Filled 2015-04-29: qty 2

## 2015-04-29 MED ORDER — LACTATED RINGERS IV SOLN
INTRAVENOUS | Status: DC
  Administered 2015-04-29: 07:00:00 via INTRAVENOUS

## 2015-04-29 MED ORDER — 0.9 % SODIUM CHLORIDE (POUR BTL) OPTIME
TOPICAL | Status: DC | PRN
  Administered 2015-04-29: 1000 mL

## 2015-04-29 MED ORDER — LIDOCAINE HCL (CARDIAC) 10 MG/ML IV SOLN
INTRAVENOUS | Status: DC | PRN
  Administered 2015-04-29: 50 mg via INTRAVENOUS

## 2015-04-29 MED ORDER — FENTANYL CITRATE (PF) 100 MCG/2ML IJ SOLN
25.0000 ug | INTRAMUSCULAR | Status: AC
  Administered 2015-04-29 (×2): 25 ug via INTRAVENOUS

## 2015-04-29 MED ORDER — FENTANYL CITRATE (PF) 100 MCG/2ML IJ SOLN: 25.0000 ug | INTRAMUSCULAR | Status: DC | PRN

## 2015-04-29 MED ORDER — MIDAZOLAM HCL 2 MG/2ML IJ SOLN
1.0000 mg | INTRAMUSCULAR | Status: DC | PRN
  Administered 2015-04-29: 2 mg via INTRAVENOUS

## 2015-04-29 MED ORDER — METHYLERGONOVINE MALEATE 0.2 MG/ML IJ SOLN
INTRAMUSCULAR | Status: AC
  Filled 2015-04-29: qty 1

## 2015-04-29 MED ORDER — ONDANSETRON HCL 4 MG/2ML IJ SOLN: 4.0000 mg | Freq: Once | INTRAMUSCULAR | Status: DC | PRN

## 2015-04-29 MED ORDER — ONDANSETRON HCL 4 MG/2ML IJ SOLN
4.0000 mg | Freq: Once | INTRAMUSCULAR | Status: AC
  Administered 2015-04-29: 4 mg via INTRAVENOUS

## 2015-04-29 MED ORDER — BUPIVACAINE-EPINEPHRINE (PF) 0.5% -1:200000 IJ SOLN
INTRAMUSCULAR | Status: AC
  Filled 2015-04-29: qty 30

## 2015-04-29 MED ORDER — PROPOFOL 10 MG/ML IV BOLUS
INTRAVENOUS | Status: DC | PRN
  Administered 2015-04-29: 150 mg via INTRAVENOUS
  Administered 2015-04-29: 50 mg via INTRAVENOUS

## 2015-04-29 SURGICAL SUPPLY — 21 items
BAG HAMPER (MISCELLANEOUS) ×2 IMPLANT
CLOTH BEACON ORANGE TIMEOUT ST (SAFETY) ×2 IMPLANT
COVER LIGHT HANDLE STERIS (MISCELLANEOUS) ×4 IMPLANT
COVER MAYO STAND XLG (DRAPE) ×2 IMPLANT
CURETTE VACUUM 9MM CVD CLR (CANNULA) ×2 IMPLANT
GAUZE SPONGE 4X4 16PLY XRAY LF (GAUZE/BANDAGES/DRESSINGS) ×2 IMPLANT
GLOVE BIOGEL PI IND STRL 7.0 (GLOVE) ×1 IMPLANT
GLOVE BIOGEL PI IND STRL 9 (GLOVE) ×1 IMPLANT
GLOVE BIOGEL PI INDICATOR 7.0 (GLOVE) ×1
GLOVE BIOGEL PI INDICATOR 9 (GLOVE) ×1
GLOVE ECLIPSE 9.0 STRL (GLOVE) ×2 IMPLANT
GOWN SPEC L3 XXLG W/TWL (GOWN DISPOSABLE) ×2 IMPLANT
GOWN STRL REUS W/TWL LRG LVL3 (GOWN DISPOSABLE) ×2 IMPLANT
KIT BERKELEY 1ST TRIMESTER 3/8 (MISCELLANEOUS) ×2 IMPLANT
KIT ROOM TURNOVER AP CYSTO (KITS) ×2 IMPLANT
MARKER SKIN DUAL TIP RULER LAB (MISCELLANEOUS) ×2 IMPLANT
NS IRRIG 1000ML POUR BTL (IV SOLUTION) ×2 IMPLANT
PACK BASIC III (CUSTOM PROCEDURE TRAY) ×1
PACK SRG BSC III STRL LF ECLPS (CUSTOM PROCEDURE TRAY) ×1 IMPLANT
PAD ARMBOARD 7.5X6 YLW CONV (MISCELLANEOUS) ×2 IMPLANT
SET BERKELEY SUCTION TUBING (SUCTIONS) ×2 IMPLANT

## 2015-04-29 NOTE — Brief Op Note (Signed)
04/29/2015  8:05 AM  PATIENT:  Megan Hart  30 y.o. female  PRE-OPERATIVE DIAGNOSIS:  missed abortion  POST-OPERATIVE DIAGNOSIS:  missed abortion  PROCEDURE:  Procedure(s): SUCTION DILATATION AND CURETTAGE (N/A)  SURGEON:  Surgeon(s) and Role:    * Tilda BurrowJohn Falyn Rubel V, MD - Primary  PHYSICIAN ASSISTANT:   ASSISTANTS: none   ANESTHESIA:   general and LMA  EBL:  Total I/O In: 600 [I.V.:600] Out: 0   BLOOD ADMINISTERED:none blood type is Rh+ so RhoGAM is not required  DRAINS: none   LOCAL MEDICATIONS USED:  MARCAINE     SPECIMEN:  Source of Specimen:  Products of conception  DISPOSITION OF SPECIMEN:  PATHOLOGY  COUNTS:  YES  TOURNIQUET:  * No tourniquets in log *  DICTATION: .Dragon Dictation  PLAN OF CARE: Discharge to home after PACU  PATIENT DISPOSITION:  PACU - hemodynamically stable.   Delay start of Pharmacological VTE agent (>24hrs) due to surgical blood loss or risk of bleeding: not applicable

## 2015-04-29 NOTE — Anesthesia Preprocedure Evaluation (Signed)
Anesthesia Evaluation  Patient identified by MRN, date of birth, ID band Patient awake    Reviewed: Allergy & Precautions, NPO status , Patient's Chart, lab work & pertinent test results  Airway Mallampati: II  TM Distance: >3 FB     Dental  (+) Teeth Intact, Dental Advisory Given   Pulmonary neg pulmonary ROS,    breath sounds clear to auscultation       Cardiovascular negative cardio ROS   Rhythm:Regular Rate:Normal     Neuro/Psych    GI/Hepatic negative GI ROS,   Endo/Other    Renal/GU      Musculoskeletal   Abdominal   Peds  Hematology   Anesthesia Other Findings   Reproductive/Obstetrics                             Anesthesia Physical Anesthesia Plan  ASA: I  Anesthesia Plan: General   Post-op Pain Management:    Induction: Intravenous  Airway Management Planned: LMA  Additional Equipment:   Intra-op Plan:   Post-operative Plan: Extubation in OR  Informed Consent: I have reviewed the patients History and Physical, chart, labs and discussed the procedure including the risks, benefits and alternatives for the proposed anesthesia with the patient or authorized representative who has indicated his/her understanding and acceptance.     Plan Discussed with:   Anesthesia Plan Comments:        Anesthesia Quick Evaluation  

## 2015-04-29 NOTE — Anesthesia Procedure Notes (Signed)
Procedure Name: LMA Insertion Date/Time: 04/29/2015 7:40 AM Performed by: Franco NonesYATES, Nyia Tsao S Pre-anesthesia Checklist: Patient identified, Patient being monitored, Emergency Drugs available, Timeout performed and Suction available Patient Re-evaluated:Patient Re-evaluated prior to inductionOxygen Delivery Method: Circle System Utilized Preoxygenation: Pre-oxygenation with 100% oxygen Intubation Type: IV induction Ventilation: Mask ventilation without difficulty LMA: LMA inserted LMA Size: 4.0 Number of attempts: 1 Placement Confirmation: positive ETCO2 and breath sounds checked- equal and bilateral Tube secured with: Tape Dental Injury: Teeth and Oropharynx as per pre-operative assessment

## 2015-04-29 NOTE — Interval H&P Note (Signed)
History and Physical Interval Note:  04/29/2015 7:07 AM  Megan Hart  has presented today for surgery, with the diagnosis of missed abortion  The various methods of treatment have been discussed with the patient and family. After consideration of risks, benefits and other options for treatment, the patient has consented to  Procedure(s): DILATATION AND CURETTAGE (N/A) as a surgical intervention .  The patient's history has been reviewed, patient examined, no change in status, stable for surgery.  I have reviewed the patient's chart and labs.  Questions were answered to the patient's satisfaction.     [name],Atalya Dano V

## 2015-04-29 NOTE — Anesthesia Postprocedure Evaluation (Signed)
Anesthesia Post Note  Patient: Megan Hart  Procedure(s) Performed: Procedure(s) (LRB): SUCTION DILATATION AND CURETTAGE (N/A)  Patient location during evaluation: PACU Anesthesia Type: General Level of consciousness: awake and alert Pain management: satisfactory to patient Vital Signs Assessment: post-procedure vital signs reviewed and stable Respiratory status: spontaneous breathing Cardiovascular status: stable Anesthetic complications: no    Last Vitals:  Filed Vitals:   04/29/15 0720 04/29/15 0725  BP: 138/93 133/88  Temp:    Resp: 15 16    Last Pain: There were no vitals filed for this visit.               Minerva AreolaYATES,Jahniya Duzan

## 2015-04-29 NOTE — Transfer of Care (Signed)
Immediate Anesthesia Transfer of Care Note  Patient: Megan Hart  Procedure(s) Performed: Procedure(s): SUCTION DILATATION AND CURETTAGE (N/A)  Patient Location: PACU  Anesthesia Type:General  Level of Consciousness: awake and patient cooperative  Airway & Oxygen Therapy: Patient Spontanous Breathing and non-rebreather face mask  Post-op Assessment: Report given to RN, Post -op Vital signs reviewed and stable and Patient moving all extremities  Post vital signs: Reviewed and stable  Last Vitals:  Filed Vitals:   04/29/15 0720 04/29/15 0725  BP: 138/93 133/88  Temp:    Resp: 15 16    Complications: No apparent anesthesia complications

## 2015-04-29 NOTE — H&P (View-Only) (Signed)
  Progress Notes    Expand All Collapse All    [facility] Visit  Patient name: Megan Judie PetitM LesterMRN [id number] Date of birth: 10/26/1985  CC & HPI:  Megan Hart is a 30 y.o. female presenting today for follow-up of missed abortion. Pt has not passed tissue. Pt is NOT interested in Cytotec Therapy to provoke spont Ab.Marland Kitchen. Patient states she [facility] for [facility] due to pain. Patient desires a D&C presently. Patient has known allergies to morphine, which cause nausea and vomiting. She states she does not have any dentures, partial plates, or caps.  ROS:  A complete review of systems was obtained and all systems are negative except as noted in the HPI and PMH.   Pertinent History Reviewed:  Reviewed: Significant for D&C of uterus Medical  Past Medical History  Diagnosis Date  . Medical history non-contributory   . Miscarriage 04/21/2015    Surgical Hx:  Past Surgical History  Procedure Laterality Date  . Fracture surgery    . Dilation and curettage of uterus     Medications: Reviewed & Updated - see associated section  No current outpatient prescriptions on file.   Social History: Reviewed -  reports that she has never smoked. She has never used smokeless tobacco.  Objective Findings:  Vitals: Blood pressure 142/90, height 5\' 6"  (1.676 Megan), weight 192 lb (87.091 kg), last menstrual period 02/21/2015, not currently breastfeeding.  Physical Examination: General appearance - alert, well appearing, and in no distress, oriented to person, place, and time and overweight Mental status - alert, oriented to person, place, and time, normal mood, behavior, speech, dress, motor activity, and thought processes, affect appropriate to mood Eyes - pupils equal and reactive, extraocular eye movements intact Mouth - mucous membranes moist, pharynx normal  without lesions Chest - clear to auscultation, no wheezes, rales or rhonchi, symmetric air entry Heart - normal rate, regular rhythm, normal S1, S2, no murmurs, rubs, clicks or gallops Abdomen - soft, nontender, nondistended, no masses or organomegaly Pelvic: done per JAGriffin RN last week. Discussed with pt risks and benefits of D&C. At end of discussion, pt had opportunity to ask questions and has no further questions at this time.   Greater than 50% was spent in counseling and coordination of care with the patient. Total time greater than: 15 minutes    Assessment & Plan:   A:  1. Follow-up visit of miscarriage.   P:  1. Schedule Suction D&C for Friday 04/29/15 at 7:30 am..   By signing my name below, I, [name], attest that this documentation has been prepared under the direction and in the presence of Megan BurrowJohn Ane Conerly V, MD. Electronically Signed: [name], ED Scribe. 04/25/2015. 4:29 PM.  I personally performed the services described in this documentation, which was SCRIBED in my presence. The recorded information has been reviewed and considered accurate. It has been edited as necessary during review.

## 2015-04-29 NOTE — Op Note (Signed)
04/29/2015  8:05 AM  PATIENT:  Megan Hart  30 y.o. female  PRE-OPERATIVE DIAGNOSIS:  missed abortion  POST-OPERATIVE DIAGNOSIS:  missed abortion  PROCEDURE:  Procedure(s): SUCTION DILATATION AND CURETTAGE (N/A)  SURGEON:  Surgeon(s) and Role:    * Tilda BurrowJohn Aron Needles V, MD - Primary  PHYSICIAN ASSISTANT:   ASSISTANTS: none   ANESTHESIA:   general and LMA  EBL:  Total I/O In: 600 [I.V.:600] Out: 0   BLOOD ADMINISTERED:none blood type is Rh+ so RhoGAM is not required  DRAINS: none   LOCAL MEDICATIONS USED:  MARCAINE     SPECIMEN:  Source of Specimen:  Products of conception  DISPOSITION OF SPECIMEN:  PATHOLOGY  COUNTS:  YES  TOURNIQUET:  * No tourniquets in log *  DICTATION: .Dragon Dictation  PLAN OF CARE: Discharge to home after PACU  PATIENT DISPOSITION:  PACU - hemodynamically stable.   Delay start of Pharmacological VTE agent (>24hrs) due to surgical blood loss or risk of bleeding: not applicable Indications missed abortion in and [redacted] week gestation with absence of fetal cardiac activity. Blood type is Rh+. Patient refused consideration of Cytotec Details of procedure: Patient was taken operating room prepped and draped for vaginal procedure, with timeout conducted. Vacuum was inserted cervix grasped with single-tooth tenaculum and uterus sounded in the anteflexed position to 10 cm with dilation to 27 [place] allowing introduction of 9 mm suction curved curette, with with circumferential curettage obtaining fluid and tissue consistent with missed AB. Smooth sharp curettage in all quadrants confirmed uniform gritty feel and patient went to recovery room in stable condition with minimal estimated blood loss sponge and needle counts correct

## 2015-05-02 ENCOUNTER — Encounter (HOSPITAL_COMMUNITY): Payer: Self-pay | Admitting: Obstetrics and Gynecology

## 2015-05-09 ENCOUNTER — Encounter: Payer: Self-pay | Admitting: Obstetrics and Gynecology

## 2015-05-09 ENCOUNTER — Ambulatory Visit (INDEPENDENT_AMBULATORY_CARE_PROVIDER_SITE_OTHER): Payer: Medicaid Other | Admitting: Obstetrics and Gynecology

## 2015-05-09 VITALS — BP 146/90 | Ht 65.0 in | Wt 194.5 lb

## 2015-05-09 DIAGNOSIS — R51 Headache: Secondary | ICD-10-CM

## 2015-05-09 DIAGNOSIS — O034 Incomplete spontaneous abortion without complication: Secondary | ICD-10-CM

## 2015-05-09 DIAGNOSIS — Z09 Encounter for follow-up examination after completed treatment for conditions other than malignant neoplasm: Secondary | ICD-10-CM

## 2015-05-09 DIAGNOSIS — M79605 Pain in left leg: Secondary | ICD-10-CM | POA: Diagnosis not present

## 2015-05-09 DIAGNOSIS — M549 Dorsalgia, unspecified: Secondary | ICD-10-CM | POA: Diagnosis not present

## 2015-05-09 MED ORDER — METHOCARBAMOL 500 MG PO TABS: 500.0000 mg | ORAL_TABLET | Freq: Four times a day (QID) | ORAL | Status: AC

## 2015-05-09 NOTE — Progress Notes (Signed)
Patient ID: Megan Hart, female   DOB: 03/05/1985, 30 y.o.   MRN: [id number] Pt here today for post op visit. Pt states that she started having really bad lower back pain last Friday that had her in tears. Pt states that she has 2 heat pads and a wrap on the area now. Pt states that the pain is not as bad as Friday but it is there, pt states that she would also have a headache when having the lower back pain. Pt states that when she straightens out her left leg she has severe pain in her left hip.

## 2015-05-09 NOTE — Progress Notes (Signed)
Patient ID: Megan Hart, female   DOB: 05/05/1985, 30 y.o.   MRN: [id number]   Subjective:  Megan Hart Megan Hart is a 30 y.o. female now 10 days status post SUCTION DILATATION AND CURETTAGE .   Pt reports the vaginal bleeding has stopped and lasted for approximately 5-6 days following the D&C procedure. Pt complains of sudden onset, severe, atraumatic, improving lower back pain onset 3 days ago--pt states the pain was so severe at onset it brought her to tears. Pt specifies that she was sitting 3 days ago and when she stood up from the chair, she suddenly began to experience severe lower back pain. Pt reports treating her back pain with heating pads and ibuprofen at home with some relief. Pt denies dysuria, fever. Now she's fine.  Review of Systems Negative except lower back pain    Diet:   NA   Bowel movements : normal.  Pain previously controlled with vicodin.   Objective:  BP 146/90 mmHg  Ht 5\' 5"  (1.651 m)  Wt 194 lb 8 oz (88.225 kg)  BMI 32.37 kg/m2  LMP 02/21/2015 (Exact Date) Presents for discussion only. Discussed with pt contraception management. At end of discussion, pt had opportunity to ask questions and has no further questions at this time. Greater than 50% was spent in counseling and coordination of care with the patient. Total time greater than: 10 minutes  Assessment:  Post-Op 10 days s/p SUCTION DILATATION AND CURETTAGE    Doing well postoperatively.   Plan:  1.Wound care discussed  NA 2. . current medications. Rx for muscle relaxer (Robaxin)   3. Activity restrictions: none 4. return to work: now. 5. Follow up in PRN .   By signing my name below, I, Megan Hart, attest that this documentation has been prepared under the direction and in the presence of Megan BachJohn Orvin Netter, MD. Electronically Signed: Marica OtterNusrat Hart, ED Scribe. 05/09/2015. 4:04 PM.   I personally performed the services described in this documentation, which was SCRIBED in my presence. The recorded  information has been reviewed and considered accurate. It has been edited as necessary during review. Megan Hart,Megan Berggren V, MD

## 2018-04-12 ENCOUNTER — Inpatient Hospital Stay (HOSPITAL_COMMUNITY)
Admission: AD | Admit: 2018-04-12 | Discharge: 2018-04-12 | Disposition: A | Discharge status: Home / Self Care | Payer: Medicaid Other | Source: Ambulatory Visit | Attending: Obstetrics and Gynecology | Admitting: Obstetrics and Gynecology

## 2018-04-12 ENCOUNTER — Inpatient Hospital Stay (HOSPITAL_COMMUNITY): Payer: Medicaid Other

## 2018-04-12 ENCOUNTER — Other Ambulatory Visit: Payer: Self-pay

## 2018-04-12 ENCOUNTER — Encounter (HOSPITAL_COMMUNITY): Payer: Self-pay

## 2018-04-12 DIAGNOSIS — O26851 Spotting complicating pregnancy, first trimester: Secondary | ICD-10-CM | POA: Diagnosis not present

## 2018-04-12 DIAGNOSIS — O209 Hemorrhage in early pregnancy, unspecified: Secondary | ICD-10-CM

## 2018-04-12 DIAGNOSIS — O208 Other hemorrhage in early pregnancy: Secondary | ICD-10-CM | POA: Diagnosis not present

## 2018-04-12 DIAGNOSIS — Z3A01 Less than 8 weeks gestation of pregnancy: Secondary | ICD-10-CM | POA: Diagnosis not present

## 2018-04-12 DIAGNOSIS — Z3491 Encounter for supervision of normal pregnancy, unspecified, first trimester: Secondary | ICD-10-CM | POA: Diagnosis not present

## 2018-04-12 LAB — URINALYSIS, ROUTINE W REFLEX MICROSCOPIC
BILIRUBIN URINE: NEGATIVE
Bacteria, UA: NONE SEEN
GLUCOSE, UA: NEGATIVE mg/dL
HGB URINE DIPSTICK: NEGATIVE
KETONES UR: NEGATIVE mg/dL
Nitrite: NEGATIVE
PH: 5 (ref 5.0–8.0)
Protein, ur: NEGATIVE mg/dL
SPECIFIC GRAVITY, URINE: 1.023 (ref 1.005–1.030)

## 2018-04-12 LAB — CBC
HEMATOCRIT: 37.9 % (ref 36.0–46.0)
Hemoglobin: 12 g/dL (ref 12.0–15.0)
MCH: 26.5 pg (ref 26.0–34.0)
MCHC: 31.7 g/dL (ref 30.0–36.0)
MCV: 83.8 fL (ref 80.0–100.0)
Platelets: 392 10*3/uL (ref 150–400)
RBC: 4.52 MIL/uL (ref 3.87–5.11)
RDW: 14.4 % (ref 11.5–15.5)
WBC: 10 10*3/uL (ref 4.0–10.5)
nRBC: 0 % (ref 0.0–0.2)

## 2018-04-12 LAB — WET PREP, GENITAL
SPERM: NONE SEEN
Trich, Wet Prep: NONE SEEN
Yeast Wet Prep HPF POC: NONE SEEN

## 2018-04-12 LAB — HCG, QUANTITATIVE, PREGNANCY: hCG, Beta Chain, Quant, S: 17907 m[IU]/mL — ABNORMAL HIGH (ref <5)

## 2018-04-12 LAB — POCT PREGNANCY, URINE: Preg Test, Ur: POSITIVE — AB

## 2018-04-12 NOTE — MAU Provider Note (Signed)
History     CSN: [id number]  Arrival date and time: 04/12/18 1237   First Provider Initiated Contact with Patient 04/12/18 1323      Chief Complaint  Patient presents with  . Vaginal Bleeding  . Possible Pregnancy   HPI Megan Hart is a 33 y.o. G5P1031 at [redacted]w[redacted]d who presents with brown spotting when wiping this morning. She has not seen any discharge or bleeding since then. She denies any pain. She reports an ultrasound in the office this week that showed a 6 week IUP. She is concerned because she has a history of multiple miscarriages.  OB History    Gravida  5   Para  1   Term  1   Preterm      AB  3   Living  1     SAB  3   TAB      Ectopic      Multiple      Live Births  1           Past Medical History:  Diagnosis Date  . Medical history non-contributory   . Miscarriage 04/21/2015    Past Surgical History:  Procedure Laterality Date  . DILATION AND CURETTAGE OF UTERUS    . DILATION AND CURETTAGE OF UTERUS N/A 04/29/2015   Procedure: SUCTION DILATATION AND CURETTAGE;  Surgeon: [name], MD;  Location: [facility];  Service: Gynecology;  Laterality: N/A;  . FRACTURE SURGERY Right    arm; surgery x2    Family History  Problem Relation Age of Onset  . Hypertension Mother   . Hypertension Father   . Heart disease Father   . Aneurysm Father     Social History   Tobacco Use  . Smoking status: Never Smoker  . Smokeless tobacco: Never Used  Substance Use Topics  . Alcohol use: No  . Drug use: No    Allergies:  Allergies  Allergen Reactions  . Morphine Nausea And Vomiting    Medications Prior to Admission  Medication Sig Dispense Refill Last Dose  . methocarbamol (ROBAXIN) 500 MG tablet Take 1 tablet (500 mg total) by mouth 4 (four) times daily. 20 tablet 1   . Multiple Vitamins-Minerals (MULTIVITAMIN GUMMIES ADULT PO) Take 1 tablet by mouth daily.   Taking    Review of Systems  Constitutional: Negative.  Negative for fatigue and  fever.  HENT: Negative.   Respiratory: Negative.  Negative for shortness of breath.   Cardiovascular: Negative.  Negative for chest pain.  Gastrointestinal: Negative.  Negative for abdominal pain, constipation, diarrhea, nausea and vomiting.  Genitourinary: Positive for vaginal bleeding. Negative for dysuria.  Neurological: Negative.  Negative for dizziness and headaches.   Physical Exam   Blood pressure 137/87, pulse 69, temperature 98 F (36.7 C), temperature source Oral, resp. rate 16, height 5\' 5"  (1.651 m), weight 88.3 kg, last menstrual period 02/16/2018, SpO2 100 %.  Physical Exam  Nursing note and vitals reviewed. Constitutional: She is oriented to person, place, and time. She appears well-developed and well-nourished. No distress.  HENT:  Head: Normocephalic.  Eyes: Pupils are equal, round, and reactive to light.  Cardiovascular: Normal rate, regular rhythm and normal heart sounds.  Respiratory: Effort normal and breath sounds normal. No respiratory distress.  GI: Soft. Bowel sounds are normal. She exhibits no distension. There is no abdominal tenderness.  Genitourinary:    Genitourinary Comments: Pelvic exam: Cervix pink, visually closed, without lesion, scant white creamy discharge, vaginal walls and external  genitalia normal, no blood in vault Bimanual exam: Cervix 0/long/high, firm, anterior, neg CMT, uterus nontender adnexa without tenderness, enlargement, or mass    Neurological: She is alert and oriented to person, place, and time.  Skin: Skin is warm and dry.  Psychiatric: She has a normal mood and affect. Her behavior is normal. Judgment and thought content normal.    MAU Course  Procedures Results for orders placed or performed during the hospital encounter of 04/12/18 (from the past 24 hour(s))  Pregnancy, urine POC     Status: Abnormal   Collection Time: 04/12/18 12:55 PM  Result Value Ref Range   Preg Test, Ur POSITIVE (A) NEGATIVE  Urinalysis, Routine w  reflex microscopic     Status: Abnormal   Collection Time: 04/12/18 12:58 PM  Result Value Ref Range   Color, Urine YELLOW YELLOW   APPearance HAZY (A) CLEAR   Specific Gravity, Urine 1.023 1.005 - 1.030   pH 5.0 5.0 - 8.0   Glucose, UA NEGATIVE NEGATIVE mg/dL   Hgb urine dipstick NEGATIVE NEGATIVE   Bilirubin Urine NEGATIVE NEGATIVE   Ketones, ur NEGATIVE NEGATIVE mg/dL   Protein, ur NEGATIVE NEGATIVE mg/dL   Nitrite NEGATIVE NEGATIVE   Leukocytes,Ua MODERATE (A) NEGATIVE   RBC / HPF 0-5 0 - 5 RBC/hpf   WBC, UA 6-10 0 - 5 WBC/hpf   Bacteria, UA NONE SEEN NONE SEEN   Squamous Epithelial / LPF 6-10 0 - 5   Mucus PRESENT    Hyaline Casts, UA PRESENT   Wet prep, genital     Status: Abnormal   Collection Time: 04/12/18  1:30 PM  Result Value Ref Range   Yeast Wet Prep HPF POC NONE SEEN NONE SEEN   Trich, Wet Prep NONE SEEN NONE SEEN   Clue Cells Wet Prep HPF POC PRESENT (A) NONE SEEN   WBC, Wet Prep HPF POC MODERATE (A) NONE SEEN   Sperm NONE SEEN   CBC     Status: None   Collection Time: 04/12/18  1:56 PM  Result Value Ref Range   WBC 10.0 4.0 - 10.5 K/uL   RBC 4.52 3.87 - 5.11 MIL/uL   Hemoglobin 12.0 12.0 - 15.0 g/dL   HCT 77.1 16.5 - 79.0 %   MCV 83.8 80.0 - 100.0 fL   MCH 26.5 26.0 - 34.0 pg   MCHC 31.7 30.0 - 36.0 g/dL   RDW 38.3 33.8 - 32.9 %   Platelets 392 150 - 400 K/uL   nRBC 0.0 0.0 - 0.2 %  hCG, quantitative, pregnancy     Status: Abnormal   Collection Time: 04/12/18  1:56 PM  Result Value Ref Range   hCG, Beta Chain, Quant, S 17,907 (H) <5 mIU/mL   US Ob Less Than 14 Weeks With Ob Transvaginal  Result Date: 04/12/2018 CLINICAL DATA:  33 year old pregnant female with vaginal bleeding. Estimated gestational age of [redacted] weeks 6 days by LMP. Beta HCG 17,900. EXAM: OBSTETRIC <14 WK [place] AND TRANSVAGINAL OB US TECHNIQUE: Both transabdominal and transvaginal ultrasound examinations were performed for complete evaluation of the gestation as well as the maternal uterus,  adnexal regions, and pelvic cul-de-sac. Transvaginal technique was performed to assess early pregnancy. COMPARISON:  None. FINDINGS: Intrauterine gestational sac: Single Yolk sac:  Not Visualized. Embryo:  Visualized. Cardiac Activity: Not visualized CRL:  3.4 mm   5 w   6 d                  [place]  EDC: 12/07/2018 Subchorionic hemorrhage:  None visualized. Maternal uterus/adnexae: The ovaries are unremarkable. No free fluid or adnexal mass. IMPRESSION: Single intrauterine gestation with estimated gestational age of [redacted] weeks 6 days by crown-rump length. No fetal cardiac activity at this time which is indeterminate for nonviability. Interval follow-up recommended. No subchorionic hemorrhage, free fluid or adnexal mass. Electronically Signed   By: Harmon Pier M.D.   On: 04/12/2018 15:54    MDM Prenatal records from private office not on file. Labs ordered and reviewed.  UA, UPT CBC, HCG ABO/Rh- O Pos Wet prep- positive for clue cells but patient denies abnormal discharge or odor, will hold off on treating in first trimester Gc/chlamydia US OB Comp Less 14 weeks with Transvaginal   Assessment and Plan   1. Normal intrauterine pregnancy on prenatal ultrasound in first trimester   2. Vaginal bleeding affecting early pregnancy    -Discharge home in stable condition -Vaginal bleeding and First trimester precautions discussed -Patient advised to follow-up with [facility] as scheduled for prenatal care on Wednesday -Patient may return to MAU as needed or if her condition were to change or worsen   Rolm Bookbinder CNM 04/12/2018, 1:23 PM

## 2018-04-12 NOTE — MAU Note (Signed)
Pt woke up this morning with spotting. Just sees it when she wipes. Ultrasound done in office around 6 weeks. LMP 02/16/18 No pain.

## 2018-04-12 NOTE — Discharge Instructions (Signed)

## 2018-04-12 NOTE — MAU Note (Signed)
Pt reports she is taking progesterone orally for the pregnancy

## 2018-04-14 LAB — GC/CHLAMYDIA PROBE AMP (~~LOC~~) NOT AT ARMC
Chlamydia: NEGATIVE
NEISSERIA GONORRHEA: NEGATIVE

## 2018-04-24 ENCOUNTER — Other Ambulatory Visit: Payer: Self-pay | Admitting: Obstetrics and Gynecology

## 2019-03-11 IMAGING — US US OB < 14 WEEKS - US OB TV
1 series · 15 of 28 positions shown · non-contrast
Comparison: None.

CLINICAL DATA: 32-year-old pregnant female with vaginal bleeding.
Estimated gestational age of 7 weeks 6 days by LMP. Beta HCG [DATE].

EXAM:
OBSTETRIC <14 WK US AND TRANSVAGINAL OB US
TECHNIQUE: Both transabdominal and transvaginal ultrasound examinations were
performed for complete evaluation of the gestation as well as the
maternal uterus, adnexal regions, and pelvic cul-de-sac.
Transvaginal technique was performed to assess early pregnancy.

[Series 1: us ob < 14 weeks - us ob tv · 15 of 61 slices shown]
[im 1/61]
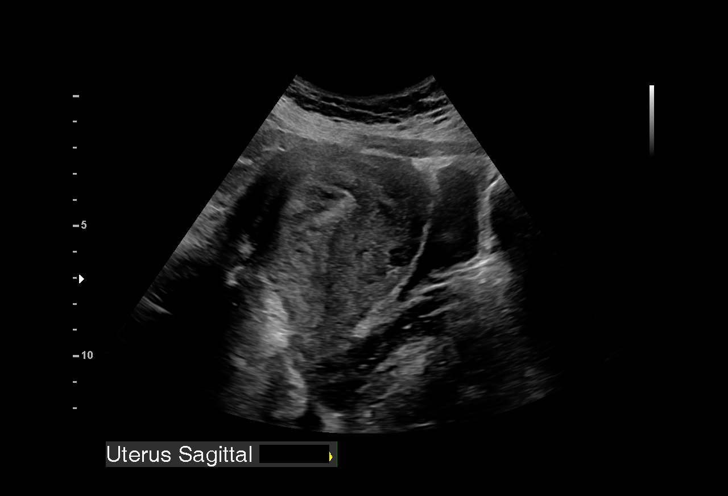
[im 5/61]
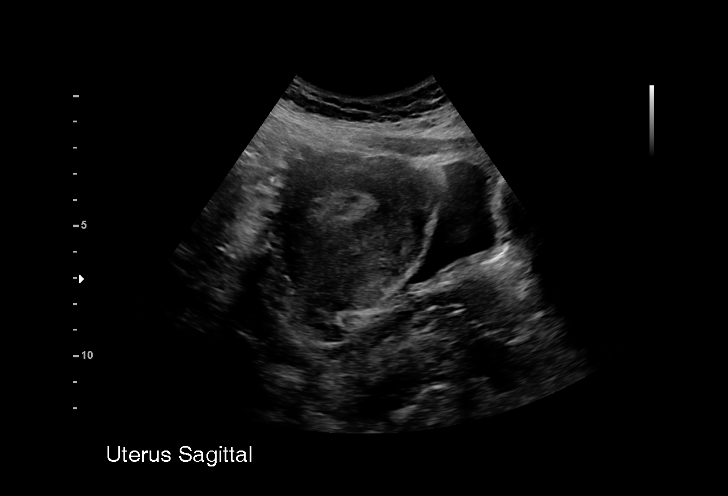
[im 9/61]
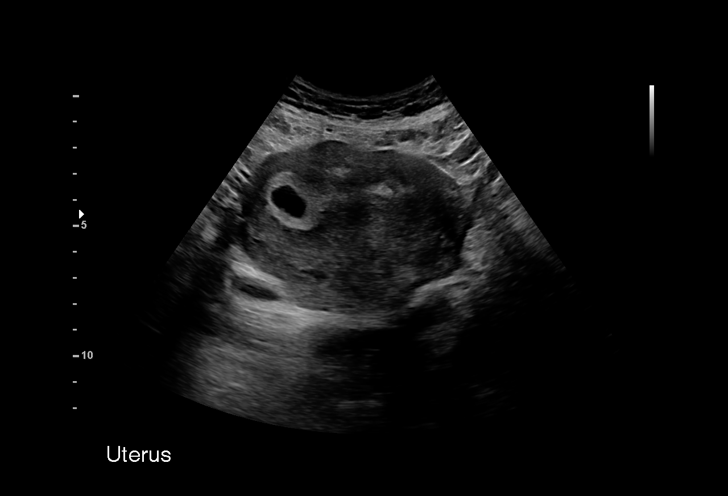
[im 14/61]
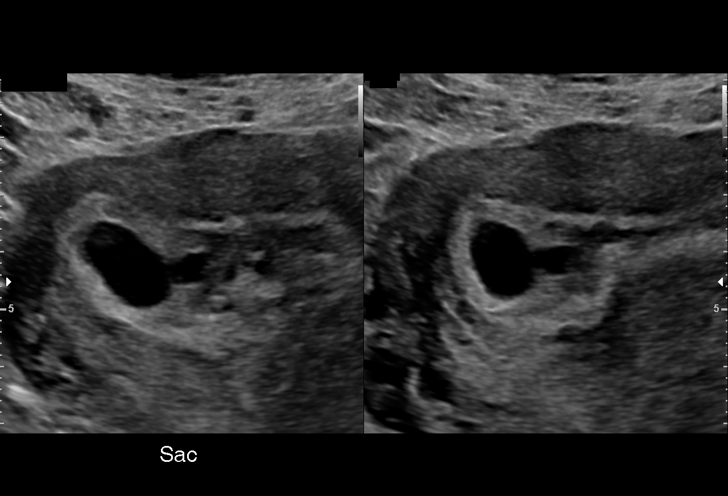
[im 18/61]
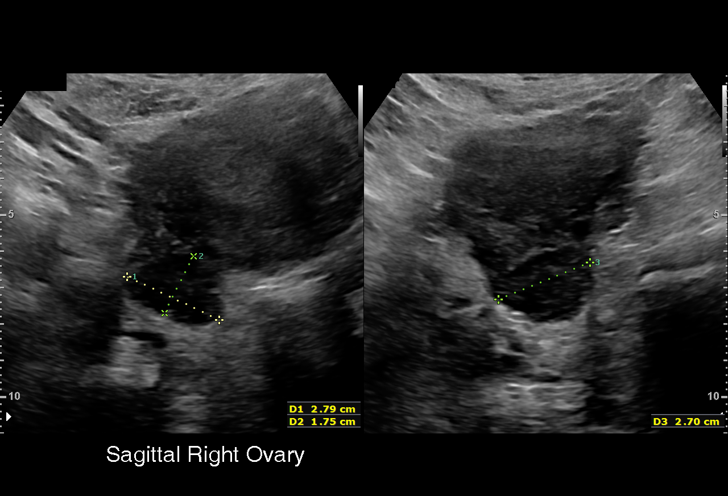
[im 23/61]
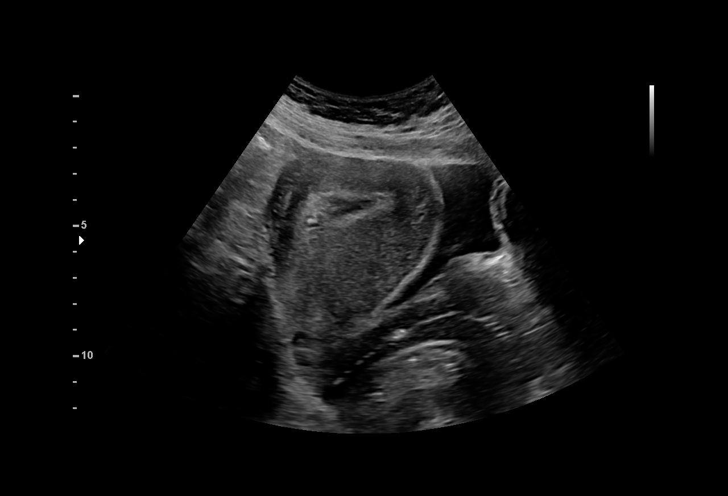
[im 27/61]
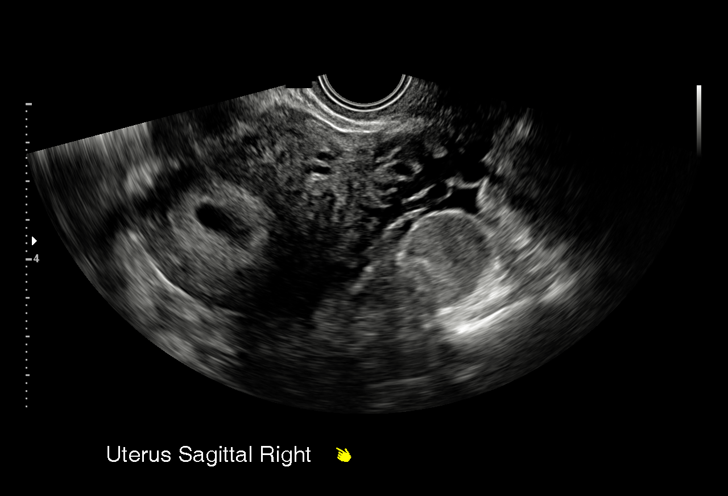
[im 32/61]
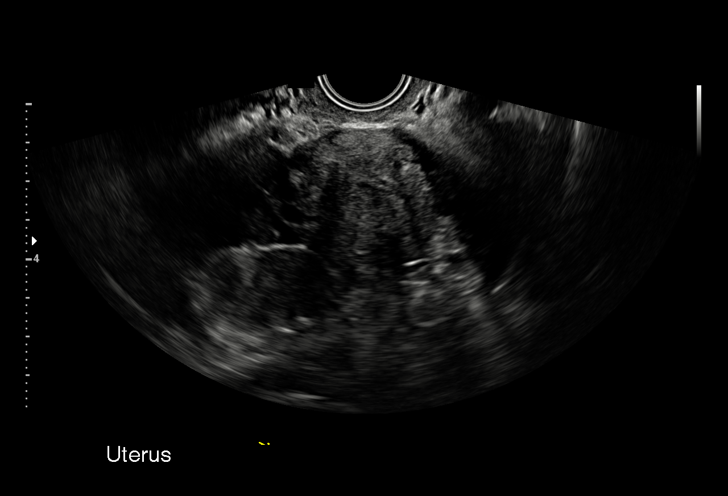
[im 34/61]
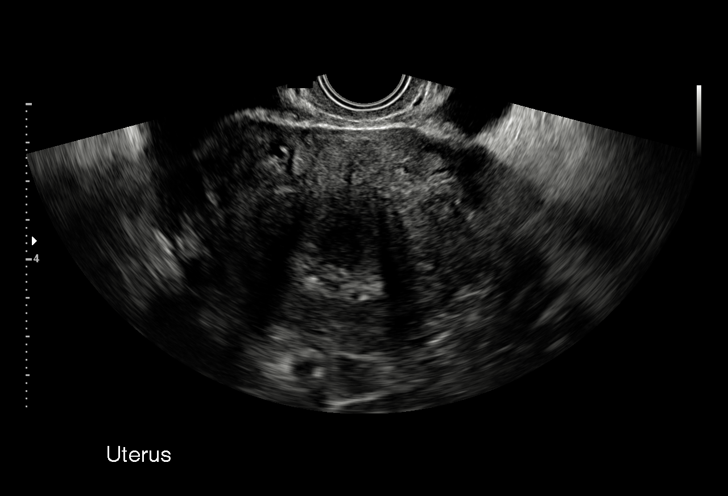
[im 38/61]
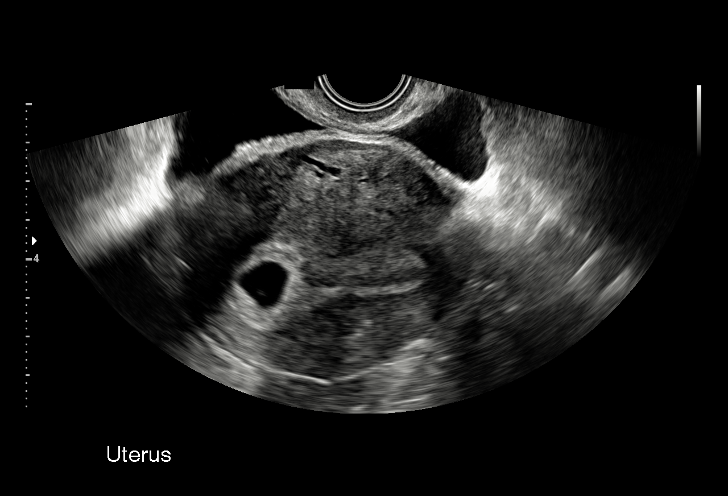
[im 43/61]
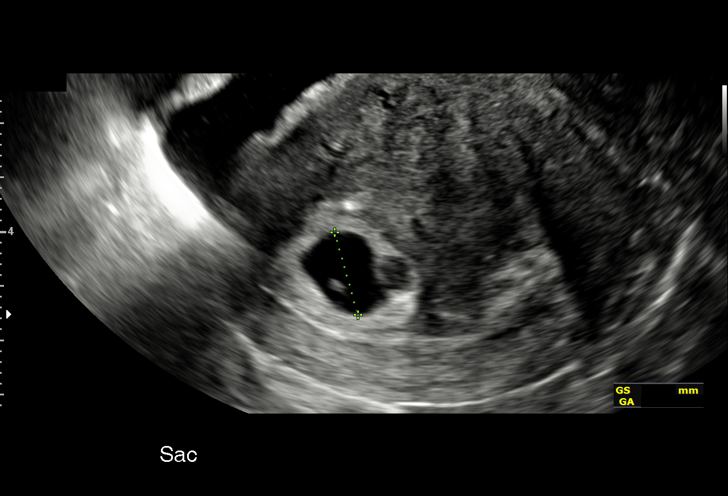
[im 47/61]
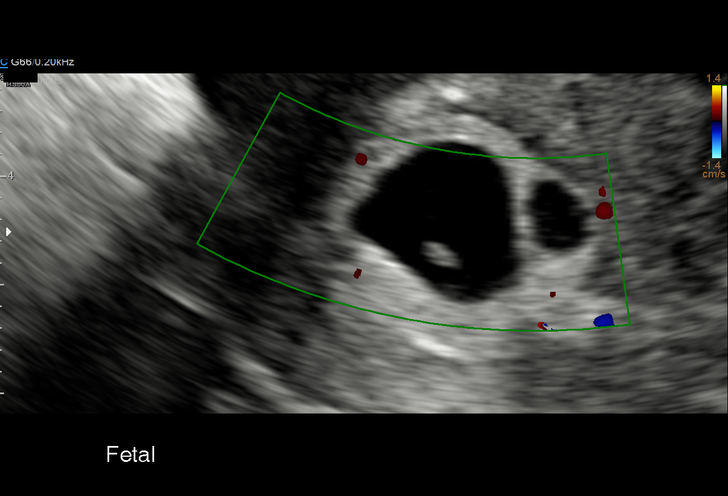
[im 52/61]
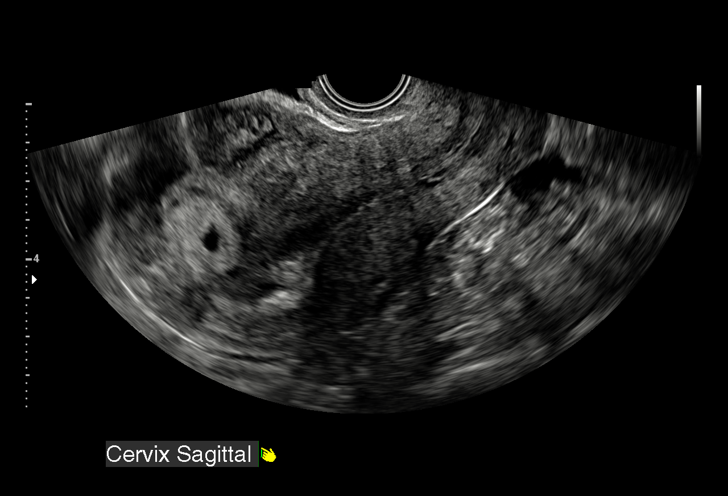
[im 56/61]
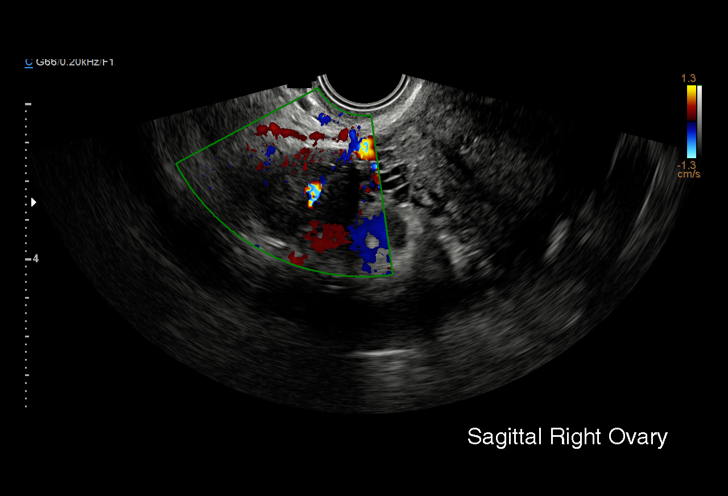
[im 61/61]
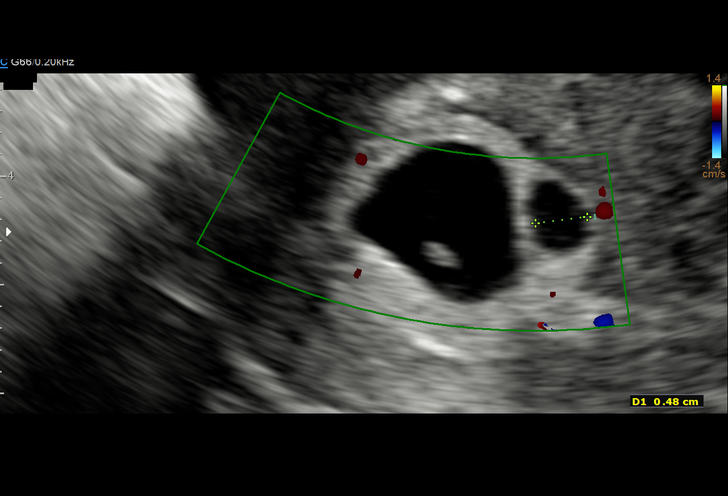

[15 of 28 positions shown; findings below may reference images not displayed]

FINDINGS: Intrauterine gestational sac: Single

Yolk sac:  Not Visualized.

Embryo:  Visualized.

Cardiac Activity: Not visualized

CRL:  3.4 mm   5 w   6 d                  US EDC: 12/07/2018

Subchorionic hemorrhage:  None visualized.

Maternal uterus/adnexae: The ovaries are unremarkable. No free fluid
or adnexal mass.
IMPRESSION: Single intrauterine gestation with estimated gestational age of 5
weeks 6 days by crown-rump length. No fetal cardiac activity at this
time which is indeterminate for nonviability. Interval follow-up
recommended.

No subchorionic hemorrhage, free fluid or adnexal mass.

## 2019-08-13 DIAGNOSIS — Z419 Encounter for procedure for purposes other than remedying health state, unspecified: Secondary | ICD-10-CM | POA: Diagnosis not present

## 2019-09-13 DIAGNOSIS — Z419 Encounter for procedure for purposes other than remedying health state, unspecified: Secondary | ICD-10-CM | POA: Diagnosis not present

## 2019-09-23 DIAGNOSIS — I10 Essential (primary) hypertension: Secondary | ICD-10-CM | POA: Diagnosis not present

## 2019-10-14 DIAGNOSIS — Z419 Encounter for procedure for purposes other than remedying health state, unspecified: Secondary | ICD-10-CM | POA: Diagnosis not present

## 2019-11-13 DIAGNOSIS — Z419 Encounter for procedure for purposes other than remedying health state, unspecified: Secondary | ICD-10-CM | POA: Diagnosis not present

## 2019-12-14 DIAGNOSIS — Z419 Encounter for procedure for purposes other than remedying health state, unspecified: Secondary | ICD-10-CM | POA: Diagnosis not present

## 2020-01-13 DIAGNOSIS — Z419 Encounter for procedure for purposes other than remedying health state, unspecified: Secondary | ICD-10-CM | POA: Diagnosis not present

## 2020-02-13 DIAGNOSIS — Z419 Encounter for procedure for purposes other than remedying health state, unspecified: Secondary | ICD-10-CM | POA: Diagnosis not present

## 2020-03-15 DIAGNOSIS — Z419 Encounter for procedure for purposes other than remedying health state, unspecified: Secondary | ICD-10-CM | POA: Diagnosis not present

## 2020-04-12 DIAGNOSIS — Z419 Encounter for procedure for purposes other than remedying health state, unspecified: Secondary | ICD-10-CM | POA: Diagnosis not present

## 2020-05-13 DIAGNOSIS — Z419 Encounter for procedure for purposes other than remedying health state, unspecified: Secondary | ICD-10-CM | POA: Diagnosis not present

## 2020-05-25 DIAGNOSIS — I10 Essential (primary) hypertension: Secondary | ICD-10-CM | POA: Diagnosis not present

## 2020-06-12 DIAGNOSIS — Z419 Encounter for procedure for purposes other than remedying health state, unspecified: Secondary | ICD-10-CM | POA: Diagnosis not present

## 2020-07-13 DIAGNOSIS — Z419 Encounter for procedure for purposes other than remedying health state, unspecified: Secondary | ICD-10-CM | POA: Diagnosis not present

## 2020-08-12 DIAGNOSIS — Z419 Encounter for procedure for purposes other than remedying health state, unspecified: Secondary | ICD-10-CM | POA: Diagnosis not present

## 2020-09-12 DIAGNOSIS — Z419 Encounter for procedure for purposes other than remedying health state, unspecified: Secondary | ICD-10-CM | POA: Diagnosis not present

## 2020-10-09 DIAGNOSIS — S62640A Nondisplaced fracture of proximal phalanx of right index finger, initial encounter for closed fracture: Secondary | ICD-10-CM | POA: Diagnosis not present

## 2020-10-09 DIAGNOSIS — X58XXXA Exposure to other specified factors, initial encounter: Secondary | ICD-10-CM | POA: Diagnosis not present

## 2020-10-09 DIAGNOSIS — M7989 Other specified soft tissue disorders: Secondary | ICD-10-CM | POA: Diagnosis not present

## 2020-10-09 DIAGNOSIS — S62642A Nondisplaced fracture of proximal phalanx of right middle finger, initial encounter for closed fracture: Secondary | ICD-10-CM | POA: Diagnosis not present

## 2020-10-12 DIAGNOSIS — S62640A Nondisplaced fracture of proximal phalanx of right index finger, initial encounter for closed fracture: Secondary | ICD-10-CM | POA: Diagnosis not present

## 2020-10-12 DIAGNOSIS — M79644 Pain in right finger(s): Secondary | ICD-10-CM | POA: Diagnosis not present

## 2020-10-13 DIAGNOSIS — Z419 Encounter for procedure for purposes other than remedying health state, unspecified: Secondary | ICD-10-CM | POA: Diagnosis not present

## 2020-10-19 DIAGNOSIS — S62640D Nondisplaced fracture of proximal phalanx of right index finger, subsequent encounter for fracture with routine healing: Secondary | ICD-10-CM | POA: Diagnosis not present

## 2020-10-31 DIAGNOSIS — S62640D Nondisplaced fracture of proximal phalanx of right index finger, subsequent encounter for fracture with routine healing: Secondary | ICD-10-CM | POA: Diagnosis not present

## 2020-11-12 DIAGNOSIS — Z419 Encounter for procedure for purposes other than remedying health state, unspecified: Secondary | ICD-10-CM | POA: Diagnosis not present

## 2020-11-21 DIAGNOSIS — S62640D Nondisplaced fracture of proximal phalanx of right index finger, subsequent encounter for fracture with routine healing: Secondary | ICD-10-CM | POA: Diagnosis not present

## 2020-11-29 DIAGNOSIS — L918 Other hypertrophic disorders of the skin: Secondary | ICD-10-CM | POA: Diagnosis not present

## 2020-11-29 DIAGNOSIS — Z01419 Encounter for gynecological examination (general) (routine) without abnormal findings: Secondary | ICD-10-CM | POA: Diagnosis not present

## 2020-11-29 DIAGNOSIS — Z124 Encounter for screening for malignant neoplasm of cervix: Secondary | ICD-10-CM | POA: Diagnosis not present

## 2020-11-29 DIAGNOSIS — Z3009 Encounter for other general counseling and advice on contraception: Secondary | ICD-10-CM | POA: Diagnosis not present

## 2020-12-13 DIAGNOSIS — Z419 Encounter for procedure for purposes other than remedying health state, unspecified: Secondary | ICD-10-CM | POA: Diagnosis not present

## 2021-01-12 DIAGNOSIS — Z419 Encounter for procedure for purposes other than remedying health state, unspecified: Secondary | ICD-10-CM | POA: Diagnosis not present

## 2021-02-12 DIAGNOSIS — Z419 Encounter for procedure for purposes other than remedying health state, unspecified: Secondary | ICD-10-CM | POA: Diagnosis not present

## 2021-03-15 DIAGNOSIS — Z20828 Contact with and (suspected) exposure to other viral communicable diseases: Secondary | ICD-10-CM | POA: Diagnosis not present

## 2021-03-15 DIAGNOSIS — Z419 Encounter for procedure for purposes other than remedying health state, unspecified: Secondary | ICD-10-CM | POA: Diagnosis not present

## 2021-04-12 DIAGNOSIS — Z419 Encounter for procedure for purposes other than remedying health state, unspecified: Secondary | ICD-10-CM | POA: Diagnosis not present

## 2021-05-13 DIAGNOSIS — Z419 Encounter for procedure for purposes other than remedying health state, unspecified: Secondary | ICD-10-CM | POA: Diagnosis not present

## 2021-06-12 DIAGNOSIS — Z419 Encounter for procedure for purposes other than remedying health state, unspecified: Secondary | ICD-10-CM | POA: Diagnosis not present

## 2021-07-13 DIAGNOSIS — Z419 Encounter for procedure for purposes other than remedying health state, unspecified: Secondary | ICD-10-CM | POA: Diagnosis not present

## 2021-08-12 DIAGNOSIS — Z419 Encounter for procedure for purposes other than remedying health state, unspecified: Secondary | ICD-10-CM | POA: Diagnosis not present

## 2021-09-12 DIAGNOSIS — Z419 Encounter for procedure for purposes other than remedying health state, unspecified: Secondary | ICD-10-CM | POA: Diagnosis not present

## 2021-10-13 DIAGNOSIS — Z419 Encounter for procedure for purposes other than remedying health state, unspecified: Secondary | ICD-10-CM | POA: Diagnosis not present

## 2021-11-12 DIAGNOSIS — Z419 Encounter for procedure for purposes other than remedying health state, unspecified: Secondary | ICD-10-CM | POA: Diagnosis not present

## 2021-12-13 DIAGNOSIS — Z419 Encounter for procedure for purposes other than remedying health state, unspecified: Secondary | ICD-10-CM | POA: Diagnosis not present
